# Patient Record
Sex: Male | Born: 1975
Health system: Northeastern US, Community
[De-identification: ages and names within clinical notes are randomized; demographics above are authoritative.]

## PROBLEM LIST (undated history)

## (undated) DIAGNOSIS — F32A Depression, unspecified: Secondary | ICD-10-CM

## (undated) DIAGNOSIS — J302 Other seasonal allergic rhinitis: Secondary | ICD-10-CM

## (undated) DIAGNOSIS — F329 Major depressive disorder, single episode, unspecified: Secondary | ICD-10-CM

---
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
## 2005-11-04 ENCOUNTER — Encounter (HOSPITAL_BASED_OUTPATIENT_CLINIC_OR_DEPARTMENT_OTHER): Payer: Self-pay

## 2006-03-31 HISTORY — PX: CURTG/CAUT ANAL FISSURE W/DILAT SPHNCTR SPX SBSQ: GID442

## 2006-09-02 ENCOUNTER — Encounter (HOSPITAL_BASED_OUTPATIENT_CLINIC_OR_DEPARTMENT_OTHER): Payer: Self-pay

## 2006-09-02 ENCOUNTER — Ambulatory Visit (HOSPITAL_BASED_OUTPATIENT_CLINIC_OR_DEPARTMENT_OTHER): Payer: PRIVATE HEALTH INSURANCE

## 2006-09-02 VITALS — BP 124/80 | HR 73 | Temp 98.4°F | Ht <= 58 in | Wt 191.0 lb

## 2006-09-02 DIAGNOSIS — Z0000 Encounter for general adult medical examination without abnormal findings: Principal | ICD-10-CM

## 2006-09-02 DIAGNOSIS — J301 Allergic rhinitis due to pollen: Secondary | ICD-10-CM

## 2006-09-02 DIAGNOSIS — K602 Anal fissure, unspecified: Secondary | ICD-10-CM

## 2006-09-02 LAB — BLOOD COUNT COMPLETE AUTO&AUTO DIFRNTL WBC
BASOPHIL %: 0.4 % (ref 0.0–2.0)
EOSINOPHIL %: 11.6 % — ABNORMAL HIGH (ref 0.0–7.0)
HEMATOCRIT: 43 % (ref 42.0–54.0)
HEMOGLOBIN: 14.4 g/dl (ref 14.0–18.0)
LYMPHOCYTE %: 28.2 % (ref 13.0–39.0)
MEAN CORP HGB CONC: 33.6 g/dl (ref 32.0–36.0)
MEAN CORPUSCULAR HGB: 30.3 pg (ref 27.0–33.0)
MEAN CORPUSCULAR VOL: 90.2 fl (ref 80.0–100.0)
MEAN PLATELET VOLUME: 9.3 fl (ref 6.4–10.8)
MONOCYTE %: 8.4 % (ref 1.0–12.0)
NEUTROPHIL %: 51.4 % (ref 46.0–79.0)
PLATELET COUNT: 204 10*3/uL (ref 150–400)
RBC DISTRIBUTION WIDTH: 13.1 % (ref 11.5–14.3)
RED BLOOD CELL COUNT: 4.77 M/uL (ref 4.50–6.10)
WHITE BLOOD CELL COUNT: 6.4 10*3/uL (ref 4.0–10.8)

## 2006-09-02 LAB — CHG LIPOPROTEIN DIR MEAS HIGH DENSITY CHOLESTEROL: HIGH DENSITY LIPOPROTEIN: 33 mg/dl (ref 29–71)

## 2006-09-02 LAB — GLUCOSE RANDOM: Glucose Random: 84 mg/dl (ref 74–160)

## 2006-09-02 LAB — CHOLESTEROL SERUM/WHOLE BLOOD TOTAL: Cholesterol: 128 mg/dl (ref 0–200)

## 2006-09-02 LAB — CHG CREATININE BLOOD: CREATININE: 0.9 mg/dl (ref 0.7–1.2)

## 2006-09-02 LAB — CHG LIPOPROTEIN DIRECT MEASUREMENT LDL CHOLESTEROL: LOW DENSITY LIPOPROTEIN DIRECT: 67 mg/dl (ref 0–100)

## 2006-09-02 MED ORDER — MOTRIN 400 MG PO TABS
ORAL_TABLET | ORAL | Status: DC
Start: 2006-09-02 — End: 2009-07-25

## 2006-09-02 MED ORDER — TRAMADOL HCL 50 MG PO TABS
ORAL_TABLET | ORAL | Status: DC
Start: 2006-09-02 — End: 2007-03-15

## 2006-09-02 MED ORDER — FLONASE 50 MCG/DOSE NA INHA
NASAL | Status: DC
Start: 2006-09-02 — End: 2007-06-07

## 2006-09-02 MED ORDER — ZYRTEC 10 MG PO TABS
ORAL_TABLET | ORAL | Status: DC
Start: 2006-09-02 — End: 2007-06-07

## 2006-09-02 NOTE — Progress Notes (Signed)
Here for first visit. 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 Has had two surgeries and pain only getting worse. Getting recurrent bleeding. Hard to work due to [employer]. Using metamucil and taking fluids. Taking Percocet for pain.     Also with seasonal allergies - taking claritin with minimal relief now. Increased nasal sx.     Past med hx - only surgery was related to anal fissure. NKDA  Social History   Marital Status: Single Spouse Name:    Years of Education: Number of children:     Occupational History   None on file    Social History Main Topics   Tobacco Use: Never    Alcohol Use: Yes 1.5 oz/week   3 drinks per week   Comment: on weekends   Drug Use: No    Sexual Activity: Yes Partners with: Male    Other Topics Concern   None on file    Social History Narrative   From [place]. In [place] since 2001. Works IT trainer. Lives with brther, sister in law and neice      Review of patient's family history indicates:   Diabetes    Comment: none   Heart Mother    Comment: MI age 21   Hypertension Brother     ROS:  no fevers or weight loss  no vision trouble  no hearing abnl  no nose bleeds or trouble swallowing - nasla stuffiness as noted  no chest pain or sob  no n/v/d/- does have constipation - related to fissure  No dysuria or discharge  no skin lesions  no focal weakness, numbness, tingling  no frequent HA  Sleeps less well due to pain, worry.  PHQ 9 at 17 - patient related most of sx to his anal fissure. Things going OK with girlfriend, family - mostly increased pressure due to sx and trouble with work as a result. Diet fairly well rounded although needs increased vegetables and increased water.    PE: Wd/wn NAD, looks well  H: at  [facility], EOMI, eye grounds normal  TMs, pinna no lesions  N - significant swollen purplish turbinates bilat/T no lesions  neck no masses, thyroid normal, no cerv adenopathy  lungs clear  heart S1S2 no m  abd soft no masses, non tender no HSM  GU bilat  desc testes, no masses, uncirc, retractable, no hernia  Rectal with non inflammed ext hemorroid. Fissure not acutely inflammed  normal pedal pulses  extrem FROM, no lesions  skin no lesions  neuro symmetric reflexes, strength, sensation    A: well exam  Seasonal rhinitis  Anal fissure with pain  Plan: screening labs  Meds as ntoed  Bacitracin ointment bid to rectal area  Increase fluids, vegetables  Refer GI for eval  Pain meds  F/U 2 months or prn.

## 2006-09-04 ENCOUNTER — Telehealth (HOSPITAL_BASED_OUTPATIENT_CLINIC_OR_DEPARTMENT_OTHER): Payer: Self-pay

## 2006-09-04 NOTE — Telephone Encounter (Addendum)
T/C to pt house, to inform lab from 09/02/06 normal.

## 2006-09-07 ENCOUNTER — Encounter (HOSPITAL_BASED_OUTPATIENT_CLINIC_OR_DEPARTMENT_OTHER): Payer: Self-pay

## 2006-10-26 ENCOUNTER — Ambulatory Visit (HOSPITAL_BASED_OUTPATIENT_CLINIC_OR_DEPARTMENT_OTHER): Payer: PRIVATE HEALTH INSURANCE | Admitting: Gastroenterology

## 2006-10-26 DIAGNOSIS — K602 Anal fissure, unspecified: Principal | ICD-10-CM

## 2006-11-18 ENCOUNTER — Ambulatory Visit (HOSPITAL_BASED_OUTPATIENT_CLINIC_OR_DEPARTMENT_OTHER): Payer: PRIVATE HEALTH INSURANCE

## 2006-11-18 VITALS — BP 112/80 | Temp 97.5°F | Wt 195.0 lb

## 2006-11-18 DIAGNOSIS — K602 Anal fissure, unspecified: Principal | ICD-10-CM

## 2006-11-18 NOTE — Progress Notes (Signed)
States is having persistent and quite uncomfortable pain from his perianal fissure. Has pain 4-5 hours after each BM despite using colcace. Is using bacitracin ointment without relief. Had sphincterectomy 12/07 and then botox injection in April. Saw GI end of July - given follow up in October which is not sufficient for pt given continued sx.    PE: Looks well, NAD    Anal fissure remain, stable    A: persistent anal fissure  Plan: refer surgery for further eval

## 2006-12-01 ENCOUNTER — Ambulatory Visit (HOSPITAL_BASED_OUTPATIENT_CLINIC_OR_DEPARTMENT_OTHER): Payer: PRIVATE HEALTH INSURANCE | Admitting: Surgery

## 2006-12-01 DIAGNOSIS — K6289 Other specified diseases of anus and rectum: Principal | ICD-10-CM

## 2006-12-07 ENCOUNTER — Ambulatory Visit (HOSPITAL_BASED_OUTPATIENT_CLINIC_OR_DEPARTMENT_OTHER): Payer: Self-pay | Admitting: Gastroenterology

## 2006-12-11 LAB — SURG SPEC CLINIC NOTE

## 2007-01-18 ENCOUNTER — Encounter (HOSPITAL_BASED_OUTPATIENT_CLINIC_OR_DEPARTMENT_OTHER): Payer: Self-pay

## 2007-01-18 ENCOUNTER — Emergency Department (HOSPITAL_BASED_OUTPATIENT_CLINIC_OR_DEPARTMENT_OTHER)
Admission: RE | Admit: 2007-01-18 | Disposition: A | Discharge status: Home / Self Care | Payer: Self-pay | Source: Emergency Department

## 2007-01-18 ENCOUNTER — Ambulatory Visit (HOSPITAL_BASED_OUTPATIENT_CLINIC_OR_DEPARTMENT_OTHER): Payer: Self-pay | Admitting: Gastroenterology

## 2007-01-18 MED ORDER — IBUPROFEN 600 MG PO TABS
ORAL_TABLET | ORAL | Status: AC
Start: 2007-01-18 — End: 2007-01-28

## 2007-01-18 MED ORDER — HYDROCODONE-ACETAMINOPHEN 5-500 MG PO TABS
ORAL_TABLET | ORAL | Status: AC
Start: 2007-01-18 — End: 2007-01-23

## 2007-01-18 NOTE — Discharge Instructions (Signed)
You have an appointment tomorrow morning 01/19/2007 at 9:30am at [facility].

## 2007-01-18 NOTE — ED Notes (Signed)
<  body>  <div align="left"><font face="Arial"><span style="font-size:12pt">Pt reports while tenth step of ladder, ladder fell closing on his foot, presents with   edema to ankle abrasion to calf</span></font></div>  </body>

## 2007-01-19 LAB — XR ANKLE RIGHT MINIMUM 3 VIEWS

## 2007-01-28 LAB — EMERGENCY ROOM NOTE

## 2007-03-15 ENCOUNTER — Encounter (HOSPITAL_BASED_OUTPATIENT_CLINIC_OR_DEPARTMENT_OTHER): Payer: Self-pay

## 2007-03-15 ENCOUNTER — Ambulatory Visit (HOSPITAL_BASED_OUTPATIENT_CLINIC_OR_DEPARTMENT_OTHER): Payer: PRIVATE HEALTH INSURANCE

## 2007-03-15 VITALS — BP 120/80 | HR 62 | Temp 98.2°F | Wt 202.0 lb

## 2007-03-15 DIAGNOSIS — F339 Major depressive disorder, recurrent, unspecified: Secondary | ICD-10-CM

## 2007-03-15 DIAGNOSIS — K602 Anal fissure, unspecified: Principal | ICD-10-CM

## 2007-03-15 MED ORDER — FLUOXETINE HCL 20 MG PO CAPS
ORAL_CAPSULE | ORAL | Status: DC
Start: 2007-03-15 — End: 2007-04-07

## 2007-03-19 ENCOUNTER — Ambulatory Visit (HOSPITAL_BASED_OUTPATIENT_CLINIC_OR_DEPARTMENT_OTHER): Payer: PRIVATE HEALTH INSURANCE | Admitting: Surgery

## 2007-03-19 DIAGNOSIS — K602 Anal fissure, unspecified: Principal | ICD-10-CM

## 2007-03-23 ENCOUNTER — Encounter (HOSPITAL_BASED_OUTPATIENT_CLINIC_OR_DEPARTMENT_OTHER): Payer: Self-pay

## 2007-03-24 ENCOUNTER — Telehealth (HOSPITAL_BASED_OUTPATIENT_CLINIC_OR_DEPARTMENT_OTHER): Payer: Self-pay

## 2007-03-26 LAB — SURG SPEC CLINIC NOTE

## 2007-04-07 ENCOUNTER — Ambulatory Visit (HOSPITAL_BASED_OUTPATIENT_CLINIC_OR_DEPARTMENT_OTHER): Payer: PRIVATE HEALTH INSURANCE

## 2007-04-07 VITALS — BP 110/80 | HR 61 | Temp 98.0°F | Wt 202.0 lb

## 2007-04-07 DIAGNOSIS — F339 Major depressive disorder, recurrent, unspecified: Principal | ICD-10-CM

## 2007-04-07 MED ORDER — FLUOXETINE HCL 20 MG PO CAPS
ORAL_CAPSULE | ORAL | Status: DC
Start: 2007-04-07 — End: 2007-06-07

## 2007-04-14 ENCOUNTER — Ambulatory Visit (HOSPITAL_BASED_OUTPATIENT_CLINIC_OR_DEPARTMENT_OTHER): Payer: Self-pay

## 2007-04-14 ENCOUNTER — Telehealth (HOSPITAL_BASED_OUTPATIENT_CLINIC_OR_DEPARTMENT_OTHER): Payer: Self-pay

## 2007-04-23 ENCOUNTER — Telehealth (HOSPITAL_BASED_OUTPATIENT_CLINIC_OR_DEPARTMENT_OTHER): Payer: Self-pay

## 2007-04-28 ENCOUNTER — Telehealth (HOSPITAL_BASED_OUTPATIENT_CLINIC_OR_DEPARTMENT_OTHER): Payer: Self-pay

## 2007-04-28 NOTE — Telephone Encounter (Signed)
Tc l vm

## 2007-05-19 ENCOUNTER — Ambulatory Visit (HOSPITAL_BASED_OUTPATIENT_CLINIC_OR_DEPARTMENT_OTHER): Payer: Self-pay

## 2007-05-24 ENCOUNTER — Telehealth (HOSPITAL_BASED_OUTPATIENT_CLINIC_OR_DEPARTMENT_OTHER): Payer: Self-pay

## 2007-05-24 NOTE — Telephone Encounter (Signed)
Tc   Lvm

## 2007-05-31 ENCOUNTER — Telehealth (HOSPITAL_BASED_OUTPATIENT_CLINIC_OR_DEPARTMENT_OTHER): Payer: Self-pay

## 2007-05-31 NOTE — Telephone Encounter (Signed)
TC  Unable to reach

## 2007-06-07 ENCOUNTER — Ambulatory Visit (HOSPITAL_BASED_OUTPATIENT_CLINIC_OR_DEPARTMENT_OTHER): Payer: PRIVATE HEALTH INSURANCE

## 2007-06-07 ENCOUNTER — Encounter (HOSPITAL_BASED_OUTPATIENT_CLINIC_OR_DEPARTMENT_OTHER): Payer: Self-pay

## 2007-06-07 VITALS — BP 100/70 | HR 53 | Temp 97.8°F | Wt 209.0 lb

## 2007-06-07 DIAGNOSIS — F339 Major depressive disorder, recurrent, unspecified: Principal | ICD-10-CM

## 2007-06-07 DIAGNOSIS — J301 Allergic rhinitis due to pollen: Secondary | ICD-10-CM

## 2007-06-07 MED ORDER — FLONASE 50 MCG/DOSE NA INHA
NASAL | Status: DC
Start: 2007-06-07 — End: 2007-08-09

## 2007-06-07 MED ORDER — FLUOXETINE HCL 20 MG PO CAPS
ORAL_CAPSULE | ORAL | Status: DC
Start: 2007-06-07 — End: 2007-08-09

## 2007-06-07 MED ORDER — ZYRTEC 10 MG PO TABS
ORAL_TABLET | ORAL | Status: DC
Start: 2007-06-07 — End: 2007-08-09

## 2007-06-07 NOTE — Progress Notes (Signed)
Here for follow up depression. Continues to feel that he is coping better, has more energy, and feeling better. Has some "down days" but is going to the gym, working out. Is to schedule surgery for anal fissure which continues to bother him.    Seasonal allergies to start relatively soon - we discussed starting flonase a few weeks before sx usually begin.    PE; looks well, interactive, good eye contact.  PHQ 9 - stable at 11 (19 initially).      A: depression - improved  Allergic rhinitis/seasonal allergies  Plan: cointinue current meds  Refill allergy meds.  F/u 2 months or prn

## 2007-06-21 ENCOUNTER — Encounter (HOSPITAL_BASED_OUTPATIENT_CLINIC_OR_DEPARTMENT_OTHER): Payer: Self-pay

## 2007-08-09 ENCOUNTER — Ambulatory Visit (HOSPITAL_BASED_OUTPATIENT_CLINIC_OR_DEPARTMENT_OTHER): Payer: PRIVATE HEALTH INSURANCE

## 2007-08-09 VITALS — BP 100/60 | HR 66 | Temp 99.0°F | Wt 207.0 lb

## 2007-08-09 DIAGNOSIS — F339 Major depressive disorder, recurrent, unspecified: Principal | ICD-10-CM

## 2007-08-09 DIAGNOSIS — J301 Allergic rhinitis due to pollen: Secondary | ICD-10-CM

## 2007-08-09 MED ORDER — FLONASE 50 MCG/DOSE NA INHA
NASAL | Status: DC
Start: 2007-08-09 — End: 2009-07-25

## 2007-08-09 MED ORDER — ZYRTEC 10 MG PO TABS
ORAL_TABLET | ORAL | Status: DC
Start: 2007-08-09 — End: 2009-07-25

## 2007-08-09 NOTE — Progress Notes (Signed)
Doing well. Would like to have trial of stopping antidepressant meds. Has been on treatment for 6 months. Ran out a week ago and did not refill. Involved with friends, active socially    Seasonal allergies a problem now - wonders if can take meds Bid.    PE: looks well, animated  Increased turbinates with nasal congestion evident    A: depression - improved  Allergic rhinitis  Plan: OK for trial d/c prozac. Encourage regular exercise, activity  If return of sx can restart meds  Trial increase flonase twice a day - may decrease if sx better.  Call if sx worsen.

## 2007-09-10 ENCOUNTER — Encounter (HOSPITAL_BASED_OUTPATIENT_CLINIC_OR_DEPARTMENT_OTHER): Payer: Self-pay

## 2008-05-31 ENCOUNTER — Ambulatory Visit (HOSPITAL_BASED_OUTPATIENT_CLINIC_OR_DEPARTMENT_OTHER): Payer: PRIVATE HEALTH INSURANCE

## 2009-07-25 ENCOUNTER — Encounter (HOSPITAL_BASED_OUTPATIENT_CLINIC_OR_DEPARTMENT_OTHER): Payer: Self-pay

## 2009-07-25 ENCOUNTER — Ambulatory Visit (HOSPITAL_BASED_OUTPATIENT_CLINIC_OR_DEPARTMENT_OTHER): Payer: PRIVATE HEALTH INSURANCE

## 2009-07-25 VITALS — BP 126/80 | HR 75 | Temp 98.1°F | Resp 16 | Wt 207.0 lb

## 2009-07-25 DIAGNOSIS — J301 Allergic rhinitis due to pollen: Secondary | ICD-10-CM

## 2009-07-25 DIAGNOSIS — F339 Major depressive disorder, recurrent, unspecified: Principal | ICD-10-CM

## 2009-07-25 MED ORDER — FLUOXETINE HCL 20 MG PO CAPS
20.0000 mg | ORAL_CAPSULE | Freq: Every day | ORAL | Status: DC
Start: 2009-07-25 — End: 2009-09-26

## 2009-07-25 MED ORDER — CETIRIZINE HCL 10 MG PO TABS
10.00 mg | ORAL_TABLET | Freq: Every day | ORAL | Status: AC
Start: 2009-07-25 — End: 2010-07-26

## 2009-07-25 MED ORDER — TRAZODONE HCL 50 MG PO TABS
ORAL_TABLET | ORAL | Status: DC
Start: 2009-07-25 — End: 2009-09-26

## 2009-07-25 NOTE — Progress Notes (Signed)
Here with complaints of recurrent sx of depression. Had been doing well with a girlfriend and planning to get married. She had to leave and return to [place] one year ago. They tried to keep relationship but decided to break up in August. "This was very tough for me". Trying to go out with friends but does not feel good. Feels a bit confused. Denies suicidal ideation but feels anxious and scared at times. Had been given citalopram by another physician and then got it sent from [place]. Still working even though it is hard to keep motivated. Trying to go to gym 3 times a week and "the only time I really feel OK" is when at gym. Sleep is variable - sleeps better after working out.    Also - this is time for flare for seasonal allergies although not as bas\d this time.    History reviewed.  No pertinent past medical history.    Social History   Marital Status: Single  Spouse Name: N/A    Years of Education: N/A  Number of Children: N/A     Occupational History  None on file     Social History Main Topics   Tobacco Use: Never    Alcohol Use: Yes  2.5 oz/week    5 drink(s) per week         Comment: on weekends    Drug Use: No    Sexually Active: Not Currently  Partner(s): Male     Other Topics Concern   None on file     Social History Narrative    From [place]. In [place] since 2001. 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

## 2009-07-31 ENCOUNTER — Encounter (HOSPITAL_BASED_OUTPATIENT_CLINIC_OR_DEPARTMENT_OTHER): Payer: Self-pay

## 2009-08-08 ENCOUNTER — Ambulatory Visit (HOSPITAL_BASED_OUTPATIENT_CLINIC_OR_DEPARTMENT_OTHER): Payer: PRIVATE HEALTH INSURANCE

## 2009-08-16 ENCOUNTER — Emergency Department (HOSPITAL_BASED_OUTPATIENT_CLINIC_OR_DEPARTMENT_OTHER)
Admission: RE | Admit: 2009-08-16 | Disposition: A | Discharge status: Home / Self Care | Payer: Self-pay | Source: Emergency Department | Attending: Emergency Medicine | Admitting: Emergency Medicine

## 2009-08-16 ENCOUNTER — Encounter (HOSPITAL_BASED_OUTPATIENT_CLINIC_OR_DEPARTMENT_OTHER): Payer: Self-pay

## 2009-08-16 HISTORY — DX: Depression, unspecified: F32.A

## 2009-08-16 HISTORY — DX: Major depressive disorder, single episode, unspecified: F32.9

## 2009-08-16 HISTORY — DX: Other seasonal allergic rhinitis: J30.2

## 2009-08-16 MED ORDER — ONDANSETRON HCL 4 MG PO TABS
4.0000 mg | ORAL_TABLET | Freq: Three times a day (TID) | ORAL | Status: AC | PRN
Start: 2009-08-16 — End: 2009-09-15

## 2009-08-16 MED ORDER — MECLIZINE HCL 25 MG PO TABS
25.00 mg | ORAL_TABLET | Freq: Four times a day (QID) | ORAL | Status: AC | PRN
Start: 2009-08-16 — End: 2009-09-15

## 2009-08-16 NOTE — Discharge Instructions (Signed)
Vertigem (Labirintite, Tontura)  (Vertigo (Labyrinthitis, Dizziness))     O diagnóstico é que você tem vertigens, ou seja, a sensação ou ilusão que você se move quando você está parado. O maior problema é a possibilidade de você ter um ataque desses enquanto trabalha, dirige ou desempenha outras atividades complexas, podendo se machucar ou ferir outra pessoa. Pode parecer que o mundo gira à sua volta ou que você está caindo no chão, mas na verdade está em pé. Quando isto ocorre, você realmente pode vir a se jogar no chão, com a impressão que está tentando se manter em pé. A perturbação excessiva do equilíbrio freqüentemente cria náusea e vômito. Muitas vezes é bem difícil determinar as causas exatas envolvidas e muitas hipóteses são formuladas. Pode ser de origem infecciosa (viral ou bacteriana), relacionada à toxicidade de drogas, posicional (provocada meramente por uma mudança rápida de posição, tal como ao deitar ou rolar na cama). Ocorre com a Síndrome de Menière e muitas outras, conhecidas e hipotéticas (adivinhadas). As causas diferentes para os sintoma podem encher um capítulo extenso de um livro, mas a boa nova é que seu médico normalmente pode determinar a causa e tratá-la, ajudando-o. Inclui exame de neurológico e, freqüentemente, algum tipo de exame especializado.      INSTRUÇÕES PARA TRATAMENTO DOMICILIAR  Ø Siga as instruções do médico.   Ø Enquanto tiver sofrendo ataques, evite dirigir, operar maquinário pesado ou executar outras tarefas que seriam perigosas para você ou outros.   Ø Se a causa for medicamentos, evite-os e informe seu médico se notar que certa medicação parece estar associada a ataques. Alguns medicamentos empregados para tratar ataques realmente pode agravá-los. Fique alerta e informe seu médico se isto ocorrer.   Ø Se o problema que você têm é posicional, seu médico pode lhe instruir sobre movimentos e procedimentos que podem ajudar.     TELEFONE OU RETORNE PARA CUIDADOS MÉDICOS:  Ø  Se os medicamentos não parecem aliviar os ataques ou fazem piorar.   Ø Se desenvolver mudanças no tocante ao que o levou a procurar cuidados, inclusive:  l Se tiver forte dor de cabeça.   l Se náusea ou vômito não melhorar ou ficar mais forte.   l Se desenvolver alteração na visão.  l Se um membro da família ou conhecido notar mudanças em seu comportamento.   l Se houver alteração em sua condição que pareça ser mais uma mudança para o pior do que melhora.        Document Released: 03/17/2005    ExitCare® Patient Information ©2010 [employer].

## 2009-08-20 ENCOUNTER — Other Ambulatory Visit (HOSPITAL_BASED_OUTPATIENT_CLINIC_OR_DEPARTMENT_OTHER): Payer: PRIVATE HEALTH INSURANCE

## 2009-08-21 ENCOUNTER — Other Ambulatory Visit (HOSPITAL_BASED_OUTPATIENT_CLINIC_OR_DEPARTMENT_OTHER): Payer: Self-pay | Admitting: Clinical

## 2009-08-22 ENCOUNTER — Telehealth (HOSPITAL_BASED_OUTPATIENT_CLINIC_OR_DEPARTMENT_OTHER): Payer: Self-pay | Admitting: Registered Nurse

## 2009-08-23 ENCOUNTER — Telehealth (HOSPITAL_BASED_OUTPATIENT_CLINIC_OR_DEPARTMENT_OTHER): Payer: Self-pay | Admitting: Registered Nurse

## 2009-08-24 LAB — EMERGENCY ROOM NOTE

## 2009-08-29 ENCOUNTER — Ambulatory Visit (HOSPITAL_BASED_OUTPATIENT_CLINIC_OR_DEPARTMENT_OTHER): Payer: PRIVATE HEALTH INSURANCE

## 2009-08-29 VITALS — BP 130/70 | HR 72 | Temp 97.0°F | Resp 16 | Ht 77.0 in | Wt 205.0 lb

## 2009-08-29 DIAGNOSIS — H832X9 Labyrinthine dysfunction, unspecified ear: Secondary | ICD-10-CM

## 2009-08-29 DIAGNOSIS — F339 Major depressive disorder, recurrent, unspecified: Principal | ICD-10-CM

## 2009-08-29 DIAGNOSIS — H6120 Impacted cerumen, unspecified ear: Secondary | ICD-10-CM

## 2009-08-29 NOTE — Progress Notes (Signed)
Here for follow up depression. Is feeling a little better but stopped prozac last week due to onset positional vertigo. Seen in ED. Antivert not that helpful but is feeling less sx now. Depression slt better - is active with gym. Had some discharge R ear - dark colored two months ago but no other sx noted. 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

## 2009-09-10 ENCOUNTER — Ambulatory Visit (HOSPITAL_BASED_OUTPATIENT_CLINIC_OR_DEPARTMENT_OTHER): Payer: PRIVATE HEALTH INSURANCE

## 2009-09-10 DIAGNOSIS — F331 Major depressive disorder, recurrent, moderate: Principal | ICD-10-CM

## 2009-09-10 DIAGNOSIS — F411 Generalized anxiety disorder: Secondary | ICD-10-CM

## 2009-09-10 NOTE — Progress Notes (Signed)
ADULT PSYCHIATRY INITIAL EVALUATION    CHIEF COMPLAINT: "Sadness"    HISTORY of PRESENT ILLNESS:   Patient reported that symptoms of depression and anxiety started in August of last year when his girlfriend ended their romantic relationship. Girlfriend is from the [place] and she was working here as an IT consultant. They had been dating for three years and they had agreed that she was going to spend some time in her country and then they would move to [place]. After six months in the [place], she decided to end the relationship, citing that she had gotten a good job and furthermore, patient was taking too long to move to [place]. As a result, patient was devastated and started feeling very sad, crying all time, with decreased energy, anhedonia, very worried, not sleeping well, with decreased appetite and weight loss. Patient was prescribed psychotropic medications and improved a bit, but he is still complaining of sadness, lack of energy, lack of motivation, feeling guilty, with intrusive thoughts about the relationship. Patient is still not working his regular hours because there are days when he does not have the energy to get to work.   He also feels anxious, bites his nails a lot, is excessively worried and has negative thoughts.    CURRENT MEDICATIONS:   Current outpatient prescriptions:meclizine (ANTIVERT) 25 MG TABS, Take 1 tablet by mouth every 6 (six) hours as needed. for symptoms of vertigo, Disp: 20 tablet, Rfl: 0;  ondansetron (ZOFRAN) 4 MG tablet, Take 1 tablet by mouth every 8 (eight) hours as needed for Nausea. for nausea and vomitting, Disp: 8 tablet, Rfl: 0;  fluoxetine (PROZAC) 20 MG capsule, Take 1 capsule by mouth daily., Disp: 30 capsule, Rfl: 1  trazodone (DESYREL) 50 MG tablet, Take  by mouth. One to two tablets at night for sleep, Disp: 60 tablet, Rfl: 0;  cetirizine (ZYRTEC) 10 MG tablet, Take 1 tablet by mouth daily., Disp: 30 tablet, Rfl: 6    Past Medications:   Took antidepressant  in 2004 for a brief period with good benefits. Does not remember the name.     CURRENT TREATMENT:   PCP is prescribing psychotropic medications    System Involvement: None.    PAST PSYCHIATRIC HISTORY:   Depression in 2004 due to financial issues as well as missing family in [place]. He took antidepressants sent by sister from [place] for a few months. It was very effective.  He denies a history of hospitalization, violence, suicide and psychosis    SUBSTANCE USE: Used to drink socially, but not drinking now since he has stopped going out.     Family Constellation:   Father-Died from a heart attack one and a half year ago.   Mother-Died of a heart attack at the age of 10. Two brothers and one sister. One brother used to live here, but he has recently moved back to [place].    Biological Family History:   Sister with depression  Brother with history of ETOH abuse, but sober now.     CURRENT LIVING SITUATION/CURRENT SUPPORTS:   Lives with ex-sister in law and niece. Brother used to live with them, but he moved back to [place]. He is very close to his sister in law and niece. He used to have many friends, but since he became depressed and started to refuse their invitation to go out, they stopped making contact with him.    Social History:   He was born in [place], population of [place] At the age of 73, he moved to a big city [facility]) to work with his brother in his business. 10 years later, he returned to his parents' house. He worked in the parents' farm for six years, but then he decided to come to the [place]. He has been living here for the past 10 years. He currently works in Holiday representative. He didn't finish High School.   He is thinking of moving back to [place] at the end of this year, but he is not sure whether moving back is a good idea.     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]     Major Depression, Recurrent [296.30P]    PCP: Tonye Pearson, MD    MENTAL STATUS EXAM:  Appearance: Casually dressed, well groomed   Behavior: Cooperative   Alertness: Alert   Speech: Normal rate and volume   Mood: Sad, worried   Affect: Sad, worried,   Thought Process: logical, linear   Thought Content: No delusional thinking   Perceptions: Denied Hallucinations   Judgment/Impulse Control: Good/Good   Insight: Fair   Cognition: Intact   Suicidal/Homicidal: Denied    BIO/PSYCHO/SOCIAL AND RISK FORMULATION(S):   Patient is a 34 year old, single [place] man who has been struggling with marked symptoms of depression and anxiety since August of last year in the context of break up with girlfriend. During this session, he was engaged with therapist and denied safety issues.     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 Medication prescribed by PCP, transfer to Psychiatrist if needed.     Noralyn Pick, LICSW

## 2009-09-24 ENCOUNTER — Ambulatory Visit (HOSPITAL_BASED_OUTPATIENT_CLINIC_OR_DEPARTMENT_OTHER): Payer: PRIVATE HEALTH INSURANCE

## 2009-09-26 ENCOUNTER — Ambulatory Visit (HOSPITAL_BASED_OUTPATIENT_CLINIC_OR_DEPARTMENT_OTHER): Payer: PRIVATE HEALTH INSURANCE

## 2009-09-26 ENCOUNTER — Encounter (HOSPITAL_BASED_OUTPATIENT_CLINIC_OR_DEPARTMENT_OTHER): Payer: Self-pay

## 2009-09-26 VITALS — BP 116/78 | HR 79 | Temp 98.4°F | Resp 18 | Ht 77.0 in | Wt 204.0 lb

## 2009-09-26 DIAGNOSIS — F339 Major depressive disorder, recurrent, unspecified: Principal | ICD-10-CM

## 2009-09-26 DIAGNOSIS — R42 Dizziness and giddiness: Secondary | ICD-10-CM

## 2009-09-26 MED ORDER — FLUOXETINE HCL 20 MG PO CAPS
20.0000 mg | ORAL_CAPSULE | Freq: Every day | ORAL | Status: DC
Start: 2009-09-26 — End: 2010-01-16

## 2009-11-14 ENCOUNTER — Other Ambulatory Visit (HOSPITAL_BASED_OUTPATIENT_CLINIC_OR_DEPARTMENT_OTHER): Payer: PRIVATE HEALTH INSURANCE

## 2010-01-10 ENCOUNTER — Telehealth (HOSPITAL_BASED_OUTPATIENT_CLINIC_OR_DEPARTMENT_OTHER): Payer: Self-pay | Admitting: Registered Nurse

## 2010-01-16 ENCOUNTER — Ambulatory Visit (HOSPITAL_BASED_OUTPATIENT_CLINIC_OR_DEPARTMENT_OTHER): Payer: PRIVATE HEALTH INSURANCE | Admitting: Family Medicine

## 2010-01-16 VITALS — BP 130/84 | HR 56 | Temp 97.6°F | Resp 18 | Wt 203.0 lb

## 2010-01-16 DIAGNOSIS — Z23 Encounter for immunization: Secondary | ICD-10-CM

## 2010-01-16 DIAGNOSIS — F339 Major depressive disorder, recurrent, unspecified: Principal | ICD-10-CM

## 2010-01-16 DIAGNOSIS — F419 Anxiety disorder, unspecified: Secondary | ICD-10-CM

## 2010-01-16 MED ORDER — VENLAFAXINE HCL ER 37.5 MG PO CP24
37.5000 mg | ORAL_CAPSULE | Freq: Every day | ORAL | Status: DC
Start: 2010-01-16 — End: 2010-02-27

## 2010-02-27 ENCOUNTER — Ambulatory Visit (HOSPITAL_BASED_OUTPATIENT_CLINIC_OR_DEPARTMENT_OTHER): Payer: PRIVATE HEALTH INSURANCE | Admitting: Family Medicine

## 2010-02-27 VITALS — BP 110/68 | HR 57 | Temp 98.0°F | Resp 18 | Ht 77.0 in | Wt 199.0 lb

## 2010-02-27 DIAGNOSIS — F339 Major depressive disorder, recurrent, unspecified: Principal | ICD-10-CM

## 2010-02-27 LAB — HEMATOCRIT: HEMATOCRIT: 41.8 % — ABNORMAL LOW (ref 42.0–54.0)

## 2010-02-27 LAB — TSH (THYROID STIMULATING HORMONE): TSH (THYROID STIM HORMONE): 1.61 u[IU]/mL (ref 0.34–5.60)

## 2010-02-27 MED ORDER — VENLAFAXINE HCL ER 75 MG PO CP24
75.0000 mg | ORAL_CAPSULE | Freq: Every day | ORAL | Status: DC
Start: 2010-02-27 — End: 2010-11-11

## 2010-03-01 ENCOUNTER — Encounter (HOSPITAL_BASED_OUTPATIENT_CLINIC_OR_DEPARTMENT_OTHER): Payer: Self-pay | Admitting: Family Medicine

## 2010-03-15 ENCOUNTER — Other Ambulatory Visit (HOSPITAL_BASED_OUTPATIENT_CLINIC_OR_DEPARTMENT_OTHER): Payer: Self-pay | Admitting: Clinical

## 2010-04-18 ENCOUNTER — Ambulatory Visit (HOSPITAL_BASED_OUTPATIENT_CLINIC_OR_DEPARTMENT_OTHER): Payer: PRIVATE HEALTH INSURANCE

## 2010-04-18 NOTE — Progress Notes (Signed)
Encounter opened by mistake

## 2010-04-29 ENCOUNTER — Encounter (HOSPITAL_BASED_OUTPATIENT_CLINIC_OR_DEPARTMENT_OTHER): Payer: Self-pay | Admitting: Family Medicine

## 2010-04-29 ENCOUNTER — Ambulatory Visit (HOSPITAL_BASED_OUTPATIENT_CLINIC_OR_DEPARTMENT_OTHER): Payer: PRIVATE HEALTH INSURANCE | Admitting: Family Medicine

## 2010-04-29 VITALS — BP 120/82 | HR 61 | Temp 98.8°F | Resp 19 | Ht 77.0 in | Wt 201.0 lb

## 2010-04-29 DIAGNOSIS — F339 Major depressive disorder, recurrent, unspecified: Principal | ICD-10-CM

## 2010-08-16 ENCOUNTER — Ambulatory Visit (HOSPITAL_BASED_OUTPATIENT_CLINIC_OR_DEPARTMENT_OTHER): Payer: PRIVATE HEALTH INSURANCE | Admitting: Family Medicine

## 2010-08-27 ENCOUNTER — Ambulatory Visit (HOSPITAL_BASED_OUTPATIENT_CLINIC_OR_DEPARTMENT_OTHER): Payer: PRIVATE HEALTH INSURANCE | Admitting: Family Medicine

## 2010-11-11 ENCOUNTER — Ambulatory Visit (HOSPITAL_BASED_OUTPATIENT_CLINIC_OR_DEPARTMENT_OTHER): Payer: PRIVATE HEALTH INSURANCE | Admitting: Family Medicine

## 2010-11-11 VITALS — BP 130/88 | HR 73 | Temp 97.7°F | Resp 22 | Wt 214.0 lb

## 2010-11-11 DIAGNOSIS — F339 Major depressive disorder, recurrent, unspecified: Principal | ICD-10-CM

## 2010-11-11 MED ORDER — VENLAFAXINE HCL ER 75 MG PO CP24
75.0000 mg | ORAL_CAPSULE | Freq: Every day | ORAL | Status: DC
Start: 2010-11-11 — End: 2011-05-10

## 2010-11-11 NOTE — Progress Notes (Signed)
SUBJECTIVE:  35 year old male  presents for f/u deperssion. Still a little bit not movitaed. Sleeping is still not great but getting better. Would like 8 hours of sleep. Still concenerd about going back to [place]. That is a main concern.   No wt changes. Focus okay.   Saw therapist once. Seemed to heop maybe a little. And felt getting better.  No si/hi  eucolaptis prices down    Smoking Status: Never Smoker                      Alcohol Use: Yes           2.5 oz/week       5 Drinks containing 0.5 oz of alcohol per week       Comment: on weekends       Drug Use: No        Social History Narrative    From [place]. In [place] since 2001.         Works IT trainer.     And sells eucalyptus trees        Lives with brother, sister in law and neice         Family History    Diabetes      Comment: none    Heart Mother     Comment: MI age 2    Hypertension Brother     Cancer - Other      Comment: none         OBJECTIVE:  general: alert, well appearing, no distress   BP 130/88  Pulse 73  Temp(Src) 97.7 F (36.5 C) (Oral)  Resp 22  Wt 214 lb (97.07 kg)  SpO89 71%   35 year old [place] seen today for the following issues:    296.30P Major depression, recurrent  (primary encounter diagnosis)  Comment: phq9 reviwed, getting better. discussed options  Plan: venlafaxine (EFFEXOR-XR) 75 MG 24 hr capsule        cotninue  And he plans to make decision about ?return to [place] this November.    F/u in two mon, fasting, for labs and PE     The pt understands and agrees with the plan.   I have spent 15 minutes in face to face time with this patient/patient proxy of which > 50% was in counseling or coordination of care regarding above issues/Dx.

## 2011-02-11 ENCOUNTER — Ambulatory Visit (HOSPITAL_BASED_OUTPATIENT_CLINIC_OR_DEPARTMENT_OTHER): Payer: PRIVATE HEALTH INSURANCE | Admitting: Family Medicine

## 2011-07-07 ENCOUNTER — Encounter (HOSPITAL_BASED_OUTPATIENT_CLINIC_OR_DEPARTMENT_OTHER): Payer: Self-pay | Admitting: Family Medicine

## 2011-07-07 ENCOUNTER — Ambulatory Visit (HOSPITAL_BASED_OUTPATIENT_CLINIC_OR_DEPARTMENT_OTHER): Payer: Self-pay | Admitting: Family Medicine

## 2011-07-07 VITALS — BP 140/70 | HR 75 | Temp 99.1°F | Resp 20 | Wt 224.0 lb

## 2011-07-07 DIAGNOSIS — F339 Major depressive disorder, recurrent, unspecified: Principal | ICD-10-CM

## 2011-07-07 MED ORDER — SERTRALINE HCL 50 MG PO TABS
ORAL_TABLET | Freq: Every day | ORAL | Status: DC
Start: 2011-07-07 — End: 2011-11-06

## 2011-07-07 NOTE — Progress Notes (Signed)
.    Pt safe at home. OME

## 2011-07-07 NOTE — Progress Notes (Signed)
SUBJECTIVE:  36 year old male  presents for f/u depression.  Lost interst in things, 'have to drage myself to go to work". Hard to focus, hard to think staright, forgets words.  Plans in [place] are not working out as well as he wanted, investments not panning out.   Not hanging out w/ friedsn. Not married  And gets anxious, and bites his nails.  Feels stuck.  Low self esteem.  No si/hi  Doesn't sleep well    Smoking Status: Never Smoker                      Alcohol Use: Yes           2.5 oz/week       5 Drinks containing 0.5 oz of alcohol per week       Comment: on weekends       Drug Use: No        Social History Narrative    From [place]. In [place] since 2001.     Works IT trainer.     And sells eucalyptus trees      Lives with brother, sister in law and niece        Family History    Diabetes      Comment: none    Heart Mother     Comment: MI age 27    Hypertension Brother     Cancer - Other      Comment: none           OBJECTIVE:  general: alert, well appearing, no distress   BP 140/70  Pulse 75  Temp(Src) 99.1 F (37.3 C) (Temporal)  Resp 20  Wt 224 lb (101.606 kg)  SpO2 97%   Neck: Supple, no mass, no lad   Cvs:Rrr, no murmur   36 year old [place] seen today for the following issues:    296.30P Major depression, recurrent  (primary encounter diagnosis)  Comment: discussed options. He is not sure if the medicine helped, he was on a mid-ragne dose of the effexor. He also has signficant axiety.  Plan:  -decrase caffeine intake! (slowly. Discussed)  -REFERRAL TO ADULT PSYCHIATRY ( [facility])        For psychotherapy  -trial sertraline. Nurse or I will call pt in about two weeks see how doing, consdier increase dose at that time  -And f/u with pcp or me       The pt understands and agrees with the plan.   I have spent 15 minutes in face to face time with this patient/patient proxy of which > 50% was in counseling or coordination of care regarding above issues/Dx.

## 2011-07-07 NOTE — Patient Instructions (Signed)
1) avoid the supplements that have caffeine     2) If the mental health department has not called you within the next 3 business days, please call them at [phone] so that they can assist you in obtaining an appointment.    3) sertraline

## 2011-07-23 ENCOUNTER — Telehealth (HOSPITAL_BASED_OUTPATIENT_CLINIC_OR_DEPARTMENT_OTHER): Payer: Self-pay | Admitting: Family Medicine

## 2011-07-23 NOTE — Telephone Encounter (Signed)
Spoke with patient  Started taking about a 10 days ago  Did a full week of half tablet and now full tablet  No improvement, in fact would say symptoms are worse  Very sad, no motivation, very sleepy, depressed   Late to work once last week and call out sick one day  Tired all the time, wakes up several times in the night  No N/V/D, no other symptoms on the medications  Let him know the plan may be to increase dose  RN to notify provider  Agrees with plan

## 2011-07-23 NOTE — Telephone Encounter (Signed)
i called pt [phone] ,to see how doing and if medicine (ssri) helpiong him w/ depression and anxiety, if tolerating it well and if would like to increase dose at this time.  No answer, message left for him to call back.    To [facility] nurse:    If/when he calls back , can you ask him above questions.  The medicine is sertraline (ZOLOFT) 50 MG tablet . And plan would be to increase to 100mg  daily    Thank you,  Rocky Link.

## 2011-07-23 NOTE — Telephone Encounter (Signed)
Left a message on an identified voice mail  To call clinic if still with questions or concerns  Await a call back

## 2011-07-24 NOTE — Telephone Encounter (Signed)
Left a message on an identified voice mail  To call clinic if still with questions or concerns  Await a call back

## 2011-07-24 NOTE — Telephone Encounter (Signed)
Spoke with patient  Relayed message and plan  Doesn't want to come into see PCP or Dr. Billey Gosling at this time  Will continue on current dose for now and will look forward to a call next week  He will monitor symptoms and call with concerns  Rn to notify provider

## 2011-07-24 NOTE — Telephone Encounter (Signed)
To [facility] nurse:    Thank you for this information.  Let us keep the one tablet per day for now as is a little early to increase dose. i will call him next week to see how he is doing. And in the meantime he should not hesitate to call if he continues to feel poorly.   i see he has an appt w/ therpiast but that is not for a couple weeks. i can always meet with him before them if he would find that helpful.    Thank you,  Rocky Link.

## 2011-07-29 ENCOUNTER — Telehealth (HOSPITAL_BASED_OUTPATIENT_CLINIC_OR_DEPARTMENT_OTHER): Payer: Self-pay | Admitting: Family Medicine

## 2011-07-29 NOTE — Telephone Encounter (Signed)
i called pt to check in. No answer, message left.  I will try to reach him again at a later date.

## 2011-08-13 ENCOUNTER — Ambulatory Visit (HOSPITAL_BASED_OUTPATIENT_CLINIC_OR_DEPARTMENT_OTHER): Payer: PRIVATE HEALTH INSURANCE

## 2011-08-13 DIAGNOSIS — F339 Major depressive disorder, recurrent, unspecified: Principal | ICD-10-CM

## 2011-08-13 NOTE — Progress Notes (Signed)
PSYCHIATRY OUTPATIENT PROGRESS NOTE    VISIT TYPE: Psychotherapy         PROBLEMS which this visit addressed:   Problem 1: chronic depression    Problem 2: indecision about return to [place]         SOURCE(S) OF INFORMATION:  Patient     SUBJECTIVE FINDINGS:  "I have taken various medications over the last several years but nothing seems to help much. Most recently my PCP gave me sertraline but I stopped on my own after a month as it made me feel worse - More tired, over-sleeping and low libido. I've been depressed the last 3 years. When I first came to the [place] in 2001, I was really motivated. I worked hard, saved money and made some investments in [place]. Now I know I need to make some changes but I am crippled by indecision. My father lost a lot of his business when I lived there, and I am haunted by that. I am also lonely. I was dating a [place] woman but she went back to her country in 2010 and decided to stay. I was heartbroken. I had one girlfriend since then, but nobody now. I work a few days a week in [employer] but when [employer] home, I just lie in bed. I rent a room in the basement of my brother's house. There are hardly any windows. Everyone tells me that I should move, but I can't seem to decide anything. The only good thing in my life is going to the gym. I go about 3 times a week and feel better for a while afterwards, but slowly the depressed mood settles in again. I know I really need to make some changes. I still have a few friends but no one really calls me these days as I never go when invited."                                                    OBJECTIVE FINDINGS:     Appearance: Casually dressed, well groomed   Behavior: Cooperative   Alertness: Alert   Speech: Normal rate and volume   Mood: Sad, worried   Affect: full range  Thought Process: logical, linear   Thought Content: No delusional thinking   Perceptions: Denied Hallucinations   Judgment/Impulse Control: Good/Good   Insight: Fair   Cognition:  Intact   Suicidal/Homicidal: Denied      Signs and symptoms: does not get out of bed on days that he is home. Extremely low motivation. Isolated other than going to work a few days a week. Has roommate but does not interact much with him. Shares meals with sister-in-law who lives upstairs but otherwise has little social contact    Testing results:  No test results pending.        Risk behaviors: None reported.        ASSESSMENT:  Clinical formulation:    Patient is a 36 year old, single [place] man who has been struggling with marked symptoms of depression and anxiety for the last 3 years in the context of break up with [place]  girlfriend. During this session, he was engaged with therapist and denied safety issues.         Clinical interventions today and patient's response: first session with pt today. 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 Doreatha Martin 09/23/11. Discussed use of PES if symptoms increase or if he has any safety concerns     Risk plan (for patients at moderate/high risk for suicide/violence/addiction): Patient not at moderate or high risk.    Next visit: patient to be seen in 1 week.        Amount of time spent w/patient today: 45 minutes      Darlys Gales, LICSW

## 2011-08-19 ENCOUNTER — Ambulatory Visit (HOSPITAL_BASED_OUTPATIENT_CLINIC_OR_DEPARTMENT_OTHER): Payer: PRIVATE HEALTH INSURANCE

## 2011-08-19 DIAGNOSIS — F339 Major depressive disorder, recurrent, unspecified: Principal | ICD-10-CM

## 2011-08-19 NOTE — Progress Notes (Signed)
PSYCHIATRY OUTPATIENT PROGRESS NOTE    VISIT TYPE: Psychotherapy         PROBLEMS which this visit addressed:   Problem 1: chronic depression  Problem 2: indecision about return to [place]         SOURCE(S) OF INFORMATION:  Patient     SUBJECTIVE FINDINGS:  "I have taken a few positive steps since last week. I have asked my friend about moving in with him, but he's not sure when his roommate will move out. I really know that I need to make some changes. I feel really lonely here in the [place]. My brother and former sis-in-law are here, but my brother travels a lot. I really want to meet someone and start a family. Because I came through [place], it would take years to get my legal status here. Either way I'd have to wait in [place]. I've been here 12 years and it feels like it's time to go back home. What holds me back is fear: I keep remembering my life there when I did not have work. That's not the situation now, but it's my negatie thinking."                                                    OBJECTIVE FINDINGS:   Appearance: Casually dressed, well groomed   Behavior: Cooperative   Alertness: Alert   Speech: Normal rate and volume   Mood: Sad, worried   Affect: full range  Thought Process: logical, linear   Thought Content: No delusional thinking   Perceptions: Denied Hallucinations   Judgment/Impulse Control: Good/Good   Insight: Fair   Cognition: Intact   Suicidal/Homicidal: Denied      Signs and symptoms: isolates self, although he did go to party on the weekend. Does not get out of bed on days that he is home. Extremely low motivation. Isolated other than going to work a few days a week. Has roommate but does not interact much with him. Shares meals with sister-in-law who lives upstairs but otherwise has little social contact    Testing results:  No test results pending.        Risk behaviors: None reported.        ASSESSMENT:  Clinical formulation:    Patient is a 36 year old, single [place] man who has been  struggling with marked symptoms of depression and anxiety for the last 3 years in the context of break up with [place]  girlfriend. During this session, he was engaged with therapist and denied safety issues. He is moving towards making a decision about moving back to [place]. He will see PCP and consider re-starting fluoxetine which was helpful in past. He will meet with Dr. Doreatha Martin next month. He will book tickets back to [place] for September. And will consider doing some complementary treatments such as accupuncture, yoga, meditation.      Clinical interventions today and patient's response: Discussed pros and cons of staying in [place]. 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 Doreatha Martin 09/23/11. Discussed use of PES if symptoms increase or if he has any safety concerns     Risk plan (for patients at moderate/high risk for suicide/violence/addiction): Patient not at moderate or high risk.    Next visit: patient to be seen in 1 week.        Amount of time spent w/patient today: 45 minutes      Darlys Gales, LICSW

## 2011-08-26 NOTE — Progress Notes (Signed)
Chart review  Has seen therapist

## 2011-09-08 ENCOUNTER — Ambulatory Visit (HOSPITAL_BASED_OUTPATIENT_CLINIC_OR_DEPARTMENT_OTHER): Payer: PRIVATE HEALTH INSURANCE | Admitting: Family Medicine

## 2011-09-23 ENCOUNTER — Ambulatory Visit (HOSPITAL_BASED_OUTPATIENT_CLINIC_OR_DEPARTMENT_OTHER): Payer: PRIVATE HEALTH INSURANCE

## 2011-09-24 ENCOUNTER — Ambulatory Visit (HOSPITAL_BASED_OUTPATIENT_CLINIC_OR_DEPARTMENT_OTHER): Payer: PRIVATE HEALTH INSURANCE

## 2011-10-15 ENCOUNTER — Ambulatory Visit (HOSPITAL_BASED_OUTPATIENT_CLINIC_OR_DEPARTMENT_OTHER): Payer: PRIVATE HEALTH INSURANCE

## 2011-11-06 ENCOUNTER — Telehealth (HOSPITAL_BASED_OUTPATIENT_CLINIC_OR_DEPARTMENT_OTHER): Payer: Self-pay

## 2011-11-06 ENCOUNTER — Ambulatory Visit (HOSPITAL_BASED_OUTPATIENT_CLINIC_OR_DEPARTMENT_OTHER): Payer: PRIVATE HEALTH INSURANCE

## 2011-11-06 DIAGNOSIS — F339 Major depressive disorder, recurrent, unspecified: Principal | ICD-10-CM

## 2011-11-06 MED ORDER — BUPROPION HCL 100 MG PO TABS
ORAL_TABLET | ORAL | Status: DC
Start: 2011-11-06 — End: 2011-12-25

## 2011-11-06 MED ORDER — TRAZODONE HCL 50 MG PO TABS
50.0000 mg | ORAL_TABLET | Freq: Every evening | ORAL | Status: DC
Start: 2011-11-06 — End: 2012-02-24

## 2011-11-06 NOTE — Progress Notes (Signed)
ADULT PSYCHIATRY INITIAL EVALUATION      CHIEF COMPLAINT: "I have been feeling very down"    HISTORY of PRESENT ILLNESS: 42 year single [place] male, employed in [employer] and has been missing work due to the loss of motivation. Has been trying to go once or twice per week and struggling to do so; used to work all week without difficulty.     Pt reports having been feeling down, unmotivated and losing energy and drive. Appetite is intact. Concentration is poor; reports forgetfulness. Some anhedonia. Gets irritated easily. Sleep is poor; insomnia + and early awakening.     Has tried various medications by PCP but did not respond as he hoped.    In 2010 his girlfriend of 3 years had to go back to her country [place] (acculturation); this was a major loss for him and took about a year to start to get over it although has not been dating again.     Pt hopes to be able to go back to [place] but is afraid of this being a bad choice and later on regret it; this is causing some conflict. The memories of his time in [place] when he had no employment and no clear future have been emerging more and more as he tries to make this decision.    CURRENT MEDICATIONS: none      Past Medications:   Fluoxetine 20 mg: in 2008, 2009 and 2011; partial response  Venlafaxine 75 mg: in 2011-2012, possibly more depressed while on it  Sertraline 50 mg: April 2013; stopped due to poor response    CURRENT TREATMENT: PCP     System Involvement: none    PAST PSYCHIATRIC HISTORY:   Consulted with therapist Darlys Gales twice.  Had episode of depression in [place] in his teens; untreated; improved when his work situation improved.  No hx of suicide attempts.  No hx of mania or hypomania.  No hx of psychosis.     SUBSTANCE USE: None. Seldom alcohol use.     Family Constellation: brother in [place], rest of siblings in [place]    Biological Family History:  Sister - depression, took medication for 6 months (fluoxetine)  Mother - died from  AMI  Father - died at old age, cause?    CURRENT LIVING SITUATION/CURRENT SUPPORTS: lives with his brother in a basement with a small window    Social History: born in [place]; pt is youngest of 4. Reports a difficult time in his teens. 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 His brother was already living here. 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 Pt enjoys going to the gym but lately has not been able to go much.    Trauma History: none    MEDICAL HISTORY: anal fissure, allergic rhinits        MENTAL STATUS EXAM:  Appearance: well built well kempt, dressed and groomed male  Behavior: very pleasant, cooperative with good eye contact and social smile, spontaneous. No psychomotor agitation or retardation. No tremors or abnormal movements.  Alertness:  Fully alert   Speech:  Clear, with normal rate, volume and rhythm  Mood: 'depressed'  Affect: Congruent, sad baseline and full range  Thought Process: logical, linear, goal directed  Thought Content:  Motivated and hopeful. Conflicted about decision to return or not to [place]   Perceptions:  none  Judgment/Impulse Control: good/good    Insight:  good  Cognition: grossly intact  Suicidal/Homicidal: none/none      ASSESSMENT: young [place] male with genetic load for depression and personal hx of a prolonged depressed episode with partial response only in the past to three trials of low dose antidepressant (SSRI, SNRI); will target his depression and prominent loss of drive with bupropion; once improved pt can regain self esteem and confidence and likely will be able to resolve his dilemma of whether or not to return to [place]. This is pt's second depressed episode with first one in his teens that went  untreated.    DIAGNOSES:  Axis I (primary): MDD, recurrent, moderate  Axis II: deferred  Axis III:  anal fissure, allergic rhinits  Axis IV: limited supports, loss of relationship 3 yr ago, living in basement, limited supports  Axis V (current):  57  Axis V (highest in past year): 60    RISK ASSESSMENT (per scale):  Suicide: 1  Violence: 1  Addiction: 1    PLAN:   - cont seeing in C/L clinic, once stabilizing return to PCP care  - stop sertraline, pt had stopped  - start bupropion titrating to 200 mg; titrate further as needed  - consider indiv therapy if conflict not resolving  - rtc in 6 wk or earlier as needed    Moises Blood, MD

## 2011-11-06 NOTE — Progress Notes (Signed)
Pt met with Dr. Doreatha Martin today and was prescribed new medication.  SW called to ask if he wants to schedule another therapy session.  Pt said that he wants to see how helpful the new medication will be. If th medication is really helpful, he does not want therapy. He will call SW if he wants to resume therapy.

## 2011-12-25 ENCOUNTER — Ambulatory Visit (HOSPITAL_BASED_OUTPATIENT_CLINIC_OR_DEPARTMENT_OTHER): Payer: PRIVATE HEALTH INSURANCE

## 2011-12-25 DIAGNOSIS — F339 Major depressive disorder, recurrent, unspecified: Secondary | ICD-10-CM

## 2011-12-25 DIAGNOSIS — F4010 Social phobia, unspecified: Principal | ICD-10-CM

## 2011-12-25 MED ORDER — PAROXETINE HCL 20 MG PO TABS
ORAL_TABLET | ORAL | Status: DC
Start: 2011-12-25 — End: 2012-02-24

## 2011-12-25 MED ORDER — PROPRANOLOL HCL 20 MG PO TABS
ORAL_TABLET | ORAL | Status: DC
Start: 2011-12-25 — End: 2012-04-26

## 2011-12-25 NOTE — Progress Notes (Signed)
PSYCHIATRY OUTPATIENT PROGRESS NOTE    VISIT TYPE: Psychopharmacology        PROBLEMS which this visit addressed:   Problem 1: depression        SOURCE(S) OF INFORMATION:  Patient     SUBJECTIVE FINDINGS:  Started bupropion and has not noticed major improvement on it. Pt continues ruminating hid dilemma about returning [place]; perhaps more than prior to bupropion. Reportsn having felt more anxious, worried and the use of this medication has not helped much with his mood.     Pt continues feeling down and sad. Asked about his thoughts of going back to a small town in [place] he comments that meeting a girl and maybe having a family is the main reason; asked about the reasons why that would not happen here he opens up about his difficulties relating to people as result of anxiety; avoids these situations even though he wishes he could attend more and feel comfortable on them; particularly as it relates his symptoms worsen; at his last date some months he was so anxious he could note even think of what to say; he wishes it was possible for him to overcome this problem.     No safety concerns.    Sleeps well most of the time; in occasion needs trazodone which has good effect when used.                                                    MENTAL STATUS EXAM:  Appearance: well built well kempt, dressed and groomed male  Behavior: very pleasant, cooperative with good eye contact and social smile, spontaneous. No psychomotor agitation or retardation. No tremors or abnormal movements.  Alertness:  Fully alert   Speech:  Clear, with normal rate, volume and rhythm  Mood: 'depressed, anxious'  Affect: Congruent, sad baseline and full range  Thought Process: logical, linear, goal directed  Thought Content:  Motivated and hopeful. Conflicted about decision to return or not to [place]   Perceptions:  none  Judgment/Impulse Control: good/good    Insight:  good  Cognition: grossly intact  Suicidal/Homicidal: none/none      Current  Outpatient Prescriptions on File Prior to Visit:  buPROPion (WELLBUTRIN) 100 MG tablet One daily for one week, then increase to 2 daily. PLEASE FILL TODAY Disp: 60 tablet Rfl: 2   trazodone (DESYREL) 50 MG tablet Take 1 tablet by mouth nightly. PLEASE FILL TODAY Disp: 30 tablet Rfl: 2     No current facility-administered medications on file prior to visit.         Medications taken as prescribed (n/a for psychotherapy only visits): Yes    Medication side effects :  Fluoxetine 20 mg: in 2008, 2009 and 2011; partial response  Venlafaxine 75 mg: in 2011-2012, possibly more depressed while on it  Sertraline 50 mg: April 2013; stopped due to poor response  Bupropion - anxiety, worsened rumination    Testing results:  No test results pending.        Risk behaviors: None reported.          ASSESSMENT: young [place] male with genetic load for depression and personal hx of a prolonged depressed episode with partial response only in the past to three trials of low dose antidepressant (SSRI, SNRI); will target his depression and prominent loss of drive with bupropion;  once improved pt can regain self esteem and confidence and likely will be able to resolve his dilemma of whether or not to return to [place]. This is pt's second depressed episode with first one in his teens that went untreated. Driving force under his depression is chronic problems with social anxiety; pt has likely been idealizing his return to [place] a a way to overcome the difficulties he encounters here as result of this anxiety but is highly likely that he would suffer the same symptoms there as the barrier is more internal than external.    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

## 2012-01-07 ENCOUNTER — Ambulatory Visit (HOSPITAL_BASED_OUTPATIENT_CLINIC_OR_DEPARTMENT_OTHER): Payer: PRIVATE HEALTH INSURANCE

## 2012-01-07 DIAGNOSIS — F4010 Social phobia, unspecified: Principal | ICD-10-CM

## 2012-01-07 NOTE — Progress Notes (Signed)
PSYCHIATRY OUTPATIENT PROGRESS NOTE    VISIT TYPE: Psychotherapy         PROBLEMS which this visit addressed:   Problem 1: chronic depression  Problem 2: indecision about return to [place]         SOURCE(S) OF INFORMATION:  Patient     SUBJECTIVE FINDINGS:  "I have tried a few medications but nothing really helped. I started Paxil 11 days ago. I felt light-headed the first week but now I feel fine. How long does it take to get the benefit? I have noticed erectile dysfunction. Will that go away once I'm off treatment?"  "I continue to be tortured about my return to [place]. I have so much fear about going back. My brother is going back in December and encourages me to go back, too. He is [place] citizen and a Clinical research associate told me that he could get a green card for me but it would take 8-9 years. And I'd have to apply from [place] anyway. I feel too fearful and full of doubt to make a decision now. I have terrible insomnia and wake up at night, worrying about this decision. I am often too tired to work. I used to go out when I had the [place] girlfriend, but since then I have let my social connections go. I am full of self-doubt and indecision. I feel quite terrible about myself."                                               OBJECTIVE FINDINGS:   Appearance: Casually dressed, well groomed. Very dark circles under his eyes   Behavior: Cooperative, good eye contact   Alertness: Alert   Speech: Normal rate and volume . Fluent in [place]  Mood: Sad, worried   Affect: full range  Thought Process: logical, linear   Thought Content: No delusional thinking   Perceptions: Denied Hallucinations   Judgment/Impulse Control: Good/Good   Insight: Fair   Cognition: Intact   Suicidal/Homicidal: Denied      Signs and symptoms: isolates himself. Isolated other than going to work a few days a week. Still living in basement apartment with little natural light    Testing results:  No test results pending.        Risk behaviors: None reported.         ASSESSMENT:  Clinical formulation:  Patient is a 36 year old, single [place] man who has been struggling with marked symptoms of depression and anxiety for the last 3 years in the context of break up with [place]  girlfriend. He needs to make a decision about moving back to [place]. He left there 9 years ago, just 2 months after his mother died. He had a very hard time there the last 4 years and is fearful of going back to a bad situation, even though he says that his life circumstances would be much improved. We explored some complementary treatments such as accupuncture, yoga, meditation.      Clinical interventions today and patient's response: Discussed pros and cons of staying in [place]. 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 Moises Blood    Risk plan (for patients at moderate/high risk for suicide/violence/addiction): Patient not at moderate or high risk.    Next visit: patient to be seen in 3 week.        Amount of time spent w/patient today: 45 minutes      Darlys Gales, LICSW

## 2012-01-21 ENCOUNTER — Ambulatory Visit (HOSPITAL_BASED_OUTPATIENT_CLINIC_OR_DEPARTMENT_OTHER): Payer: PRIVATE HEALTH INSURANCE

## 2012-02-10 ENCOUNTER — Ambulatory Visit (HOSPITAL_BASED_OUTPATIENT_CLINIC_OR_DEPARTMENT_OTHER): Payer: PRIVATE HEALTH INSURANCE

## 2012-02-10 DIAGNOSIS — F339 Major depressive disorder, recurrent, unspecified: Principal | ICD-10-CM

## 2012-02-10 NOTE — Progress Notes (Signed)
PSYCHIATRY OUTPATIENT PROGRESS NOTE    VISIT TYPE: Psychotherapy         PROBLEMS which this visit addressed:   Problem 1: chronic depression  Problem 2: indecision about return to [place]       Problem 3: social phobia    SOURCE(S) OF INFORMATION:  Patient     SUBJECTIVE FINDINGS:  "I have had no improvement on Paxil and it has killed my libido. I think I will discontinue. I hope that Dr. Doreatha Martin can try me on another medication to treat the depression. The Paxil also made me feel more anxious. I am tormented by negative thoughts. I really want and need to make changes in my life, but lack self-confidence. I was introduced to a nice woman from [place] recently, but have not asked her out yet. All I can imagine is that she will judge me for being an immigrant, for not having much money, for living in a terrible apartment. I can see that my thoughts stop me in so many ways. I have a good boss who would offer me more hours but I don't have the motivation to work more than the minimum to pay my bills. And I am paralyzed about whether to return to [place]. My niece tells me that I should start over in a new region. The thought of moving back into the house where my parents lived is depressing. My brother is going back next week and encourages me to go back, too. He is [place] citizen and a Clinical research associate told me that he could get a green card for me but it would take 8-9 years; he is filing that paperwork now. And I'd have to apply from [place] anyway. I feel too fearful and full of doubt to make a decision now. I have terrible insomnia and wake up at night, worrying about this decision. I am often too tired to work. I am full of self-doubt and indecision. I feel quite terrible about myself."                                               OBJECTIVE FINDINGS:   Appearance: Casually dressed, well groomed. Very dark circles under his eyes. Reports sleeping 4-5 hours per night   Behavior: Cooperative, good eye contact   Alertness: Alert    Speech: Normal rate and volume . Fluent in [place]  Mood: Sad, worried   Affect: full range  Thought Process: logical, linear   Thought Content: No delusional thinking   Perceptions: Denied Hallucinations   Judgment/Impulse Control: Good/Good   Insight: Fair   Cognition: Intact   Suicidal/Homicidal: Denied      Signs and symptoms: isolates himself although has been going out more lately. Goes to gym 3-4 times/week which is the best part of his life. Still living in basement apartment with little natural light. He hates where he lives but it's cheap and brother owns the house.    Testing results:  No test results pending.        Risk behaviors: None reported.        ASSESSMENT:  Clinical formulation:  Patient is a 36 year old, single [place] man who has been struggling with marked symptoms of depression and anxiety for the last 3 years in the context of break up with [place]  girlfriend. He needs to make a decision about moving  back to [place]. He left there 12 years ago, just 2 months after his mother died. He had a very hard time there the last 4 years and is fearful of going back to a bad situation, even though he says that his life circumstances would be much improved. We explored some complementary treatments such as accupuncture, yoga, meditation.      Clinical interventions today and patient's response: Discussed learning how to identify/name his negative thoughts and to question whether they are actually true, ie women will reject him. 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 Moises Blood    Risk plan (for patients at moderate/high risk for suicide/violence/addiction): Patient not at moderate or high risk.    Next visit: patient to be seen in 3 week.        Amount of time spent w/patient today: 45 minutes      Darlys Gales, LICSW

## 2012-02-24 ENCOUNTER — Ambulatory Visit (HOSPITAL_BASED_OUTPATIENT_CLINIC_OR_DEPARTMENT_OTHER): Payer: PRIVATE HEALTH INSURANCE

## 2012-02-24 DIAGNOSIS — F4010 Social phobia, unspecified: Principal | ICD-10-CM

## 2012-02-24 MED ORDER — TRAZODONE HCL 50 MG PO TABS
50.0000 mg | ORAL_TABLET | Freq: Every evening | ORAL | Status: DC
Start: 2012-02-24 — End: 2012-04-26

## 2012-02-24 MED ORDER — BUSPIRONE HCL 10 MG PO TABS
ORAL_TABLET | ORAL | Status: DC
Start: 2012-02-24 — End: 2012-04-26

## 2012-02-24 NOTE — Progress Notes (Signed)
PSYCHIATRY OUTPATIENT PROGRESS NOTE    VISIT TYPE: Psychopharmacology        PROBLEMS which this visit addressed:   Problem 1: depression        SOURCE(S) OF INFORMATION:  Patient     SUBJECTIVE FINDINGS:  Stopped paroxetine after 6 wk due to sexual side effects and lack of response. After stopping it the loss of libido resolved.    Mood remains low mostly as result of ongoing conflict in whether or not deciding to return to [place]; not anhedonic; enjoys going to the gym and has fair energy and concentration. Still hesitant of social interaction; tried propranolol 20 mg twice with some effect. Sleep is suboptimal, about 5 hours total; uses trazodone sometimes (50 mg) but then the next morning feels tired and sedated for some hours.   to say; he wishes it was possible for him to overcome this problem.     No safety concerns.                                MENTAL STATUS EXAM:  Appearance: well built well kempt, dressed and groomed male  Behavior: very pleasant, cooperative with good eye contact and social smile, spontaneous. No psychomotor agitation or retardation. No tremors or abnormal movements.  Alertness:  Fully alert   Speech:  Clear, with normal rate, volume and rhythm  Mood: 'same'  Affect: Congruent, sad baseline and full range  Thought Process: logical, linear, goal directed  Thought Content:  Motivated and hopeful. Conflicted about decision to return or not to [place]   Perceptions:  none  Judgment/Impulse Control: good/good    Insight:  good  Cognition: grossly intact  Suicidal/Homicidal: none/none      Current Outpatient Prescriptions on File Prior to Visit:  paroxetine (PAXIL) 20 MG tablet One at night for 2 weeks, then 2 at night Disp: 60 tablet Rfl: 3   propranolol (INDERAL) 20 MG tablet 1-2 dailty as needed 1 hour prior to anxiety provoking situation Disp: 60 tablet Rfl: 3     No current facility-administered medications on file prior to visit.         Medications taken as prescribed (n/a for  psychotherapy only visits): Yes    Medication side effects :  Fluoxetine 20 mg: in 2008, 2009 and 2011; partial response  Venlafaxine 75 mg: in 2011-2012, possibly more depressed while on it  Sertraline 50 mg: April 2013; stopped due to poor response  Bupropion - anxiety, worsened rumination  Paroxetine - sexual side effects (low libido)    Testing results:  No test results pending.        Risk behaviors: None reported.          ASSESSMENT: young [place] male with genetic load for depression and personal hx of a prolonged depressed episode with partial response only in the past to three trials of low dose antidepressant (SSRI, SNRI); will target his depression and prominent loss of drive with bupropion; once improved pt can regain self esteem and confidence and likely will be able to resolve his dilemma of whether or not to return to [place]. This is pt's second depressed episode with first one in his teens that went untreated. Driving force under his depression is chronic problems with social anxiety; pt has likely been idealizing his return to [place] a a way to overcome the difficulties he encounters here as result of this anxiety but is highly likely that he  would suffer the same symptoms there as the barrier is more internal than external.    Suffering from side effects on various medication trials; pt willing to favor a medication that can help reduce his anxiety with minimal side effects, particularly sexual ones; we agreed on a trial of buspirone and depending on response may add escitalopram later on.    DIAGNOSES:  Axis I (primary): Social anxiety; MDD, recurrent, moderate  Axis II: deferred  Axis III:  anal fissure, allergic rhinits  Axis IV: limited supports, loss of relationship 3 yr ago, living in basement, limited supports  Axis V (current):  58  Axis V (highest in past year): 60    RISK ASSESSMENT (per scale):  Suicide: 1  Violence: 1  Addiction: 1    PLAN:   - cont seeing in C/L clinic, once  stabilizing return to PCP care  - stop paroxetine due to sexual side effects and lack of response  - start buspirone titrating initially to 15 mg bid  - propranolol 20-40 mg dily prn anxiety provoking event  - trazodone 25-57m g qhs prn  - consider adding escitalopram 5 mg  - resume therapy with Darlys Gales for social phobia    - rtc in 6 wk or earlier as needed    Moises Blood, MD

## 2012-03-09 ENCOUNTER — Ambulatory Visit (HOSPITAL_BASED_OUTPATIENT_CLINIC_OR_DEPARTMENT_OTHER): Payer: PRIVATE HEALTH INSURANCE

## 2012-04-26 ENCOUNTER — Telehealth (HOSPITAL_BASED_OUTPATIENT_CLINIC_OR_DEPARTMENT_OTHER): Payer: Self-pay

## 2012-04-26 ENCOUNTER — Ambulatory Visit (HOSPITAL_BASED_OUTPATIENT_CLINIC_OR_DEPARTMENT_OTHER): Payer: PRIVATE HEALTH INSURANCE

## 2012-04-26 DIAGNOSIS — F339 Major depressive disorder, recurrent, unspecified: Principal | ICD-10-CM

## 2012-04-26 MED ORDER — TRAZODONE HCL 50 MG PO TABS
50.0000 mg | ORAL_TABLET | Freq: Every evening | ORAL | Status: DC
Start: 2012-04-26 — End: 2013-07-01

## 2012-04-26 MED ORDER — PROPRANOLOL HCL 20 MG PO TABS
ORAL_TABLET | ORAL | Status: DC
Start: 2012-04-26 — End: 2012-11-02

## 2012-04-26 MED ORDER — ESCITALOPRAM OXALATE 10 MG PO TABS
10.0000 mg | ORAL_TABLET | Freq: Every day | ORAL | Status: DC
Start: 2012-04-26 — End: 2012-11-02

## 2012-04-26 NOTE — Progress Notes (Addendum)
Prior authorization request for Escitalopram 20 mg daily (starting with 10 mg)  Clinical Indication: major depression  Previous medications tried:   Fluoxetine 20 mg: in 2008, 2009 and 2011; partial response  Venlafaxine 75 mg: in 2011-2012, possibly more depressed while on it  Sertraline 50 mg: April 2013; stopped due to poor response  Bupropion - anxiety, worsened rumination  Paroxetine - sexual side effects (low libido)  Diagnostic testing information: clinical depression failing to respond or tolerate various medications. Side effect profile of lexapro and indication makes it the next best choice for this patient

## 2012-04-26 NOTE — Progress Notes (Signed)
Prior Authorization for Escitalopram 20 mg is not required by [employer].      A new prescription can be sent to patient's preferred pharmacy, [facility] outpatient pharmacy did not receive prescription Marked Normal in EPIC from 04/26/12.

## 2012-04-26 NOTE — Progress Notes (Signed)
PSYCHIATRY OUTPATIENT PROGRESS NOTE    VISIT TYPE: Psychopharmacology        PROBLEMS which this visit addressed:   Problem 1: depression        SOURCE(S) OF INFORMATION:  Patient     SUBJECTIVE FINDINGS:  Use of buspirone not effective for anxiety and mood continues low and depressed; described low self-esteem, apathy and poor confidence. Feels uncertain of what to do with his life and feels stuck unable to make decisions. Not fully anhedonic; enjoys going to the gym. Still hesitant of social interaction; tried propranolol 20 mg twice with partial effect but lately has not been socializing much as he finds himself more isolative. Sleeps better with trazodone which he uses as prn.      No safety concerns.                                MENTAL STATUS EXAM:  Appearance: well built well kempt, dressed and groomed male  Behavior: very pleasant, cooperative with good eye contact and social smile, spontaneous. No psychomotor agitation or retardation. No tremors or abnormal movements.  Alertness:  Fully alert   Speech:  Clear, with normal rate, volume and rhythm  Mood: 'same'  Affect: Congruent, sad baseline and full range  Thought Process: logical, linear, goal directed  Thought Content:  Motivated and hopeful. Conflicted about decision to return or not to [place]   Perceptions:  none  Judgment/Impulse Control: good/good    Insight:  good  Cognition: grossly intact  Suicidal/Homicidal: none/none      Current Outpatient Prescriptions on File Prior to Visit:  busPIRone (BUSPAR) 10 MG tablet haf bid x 5 days, then 1 bid x 5 days, then one and half bid Disp: 90 tablet Rfl: 3   traZODone (DESYREL) 50 MG tablet Take 1 tablet by mouth nightly. Half to one at night as needed Disp: 30 tablet Rfl: 3   propranolol (INDERAL) 20 MG tablet 1-2 dailty as needed 1 hour prior to anxiety provoking situation Disp: 60 tablet Rfl: 3     No current facility-administered medications on file prior to visit.      Medications taken as prescribed  (n/a for psychotherapy only visits): Yes    Medication side effects :  Fluoxetine 20 mg: in 2008, 2009 and 2011; partial response  Venlafaxine 75 mg: in 2011-2012, possibly more depressed while on it  Sertraline 50 mg: April 2013; stopped due to poor response  Bupropion - anxiety, worsened rumination  Paroxetine - sexual side effects (low libido)    Testing results:  No test results pending.        Risk behaviors: None reported.          ASSESSMENT: young [place] male with genetic load for depression and personal hx of a prolonged depressed episode with partial response only in the past to three trials of low dose antidepressant (SSRI, SNRI); will target his depression and prominent loss of drive with bupropion; once improved pt can regain self esteem and confidence and likely will be able to resolve his dilemma of whether or not to return to [place]. This is pt's second depressed episode with first one in his teens that went untreated. Driving force under his depression is chronic problems with social anxiety; pt has likely been idealizing his return to [place] a a way to overcome the difficulties he encounters here as result of this anxiety but is highly likely that he  would suffer the same symptoms there as the barrier is more internal than external.    Suffering from side effects on various medication trials; pt willing to favor a medication that can help reduce his anxiety with minimal side effects, particularly sexual ones; we agreed on a trial of escitalopram.    DIAGNOSES:  Axis I (primary): Social anxiety; MDD, recurrent, moderate  Axis II: deferred  Axis III:  anal fissure, allergic rhinits  Axis IV: limited supports, loss of relationship 3 yr ago, living in basement, limited supports  Axis V (current):  58  Axis V (highest in past year): 60    RISK ASSESSMENT (per scale):  Suicide: 1  Violence: 1  Addiction: 1    PLAN:   - d/c buspirone titrating due to lack of effect  - propranolol 20-40 mg dily prn  anxiety provoking event  - trazodone 25-64m g qhs prn  - start escitalopram 5 mg and increase to 10 mg after one week, PA written, rx given to pt in hand to wait until approval  - resume therapy with Darlys Gales for social phobia  - rtc in 4 wk or earlier as needed    Moises Blood, MD

## 2012-04-26 NOTE — Progress Notes (Signed)
Vm left to pt to notify that he can fill the rx

## 2012-04-28 ENCOUNTER — Telehealth (HOSPITAL_BASED_OUTPATIENT_CLINIC_OR_DEPARTMENT_OTHER): Payer: Self-pay

## 2012-04-28 NOTE — Progress Notes (Addendum)
SW reviewing chart while writing quarterly mental health treatment plan.  Pt met with psychiatrist Dr. Moises Blood yesterday.  He will start on a new anti-depressant (Lexapro) to target depression and social anxiety.    Last visit with SW was 02/10/12.  Pt says that he wants to wait to make another appt with SW until he sees if the new medication is helpful. No f/u scheduled with SW at this time per pt preference.

## 2012-06-28 ENCOUNTER — Ambulatory Visit (HOSPITAL_BASED_OUTPATIENT_CLINIC_OR_DEPARTMENT_OTHER): Payer: PRIVATE HEALTH INSURANCE

## 2012-06-28 DIAGNOSIS — F339 Major depressive disorder, recurrent, unspecified: Principal | ICD-10-CM

## 2012-06-28 MED ORDER — TADALAFIL 20 MG PO TABS
ORAL_TABLET | ORAL | Status: DC
Start: 2012-06-28 — End: 2014-03-20

## 2012-06-28 NOTE — Patient Instructions (Signed)
Maca: uma por dia    Para alergia ao polen: Loratadine 10 mg, uma por dia

## 2012-06-28 NOTE — Progress Notes (Signed)
PSYCHIATRY OUTPATIENT PROGRESS NOTE    VISIT TYPE: Psychopharmacology        PROBLEMS which this visit addressed:   Problem 1: depression        SOURCE(S) OF INFORMATION:  Patient     SUBJECTIVE FINDINGS:  Some improvement with escitalopram which he is tolerating well other than some side effects of sexual dysfunction. Using propranolol for socializing with some effect.     Pt is more optimistic and hopeful.    No new stressors.     No safety concerns.                                MENTAL STATUS EXAM:  Appearance: well built well kempt, dressed and groomed male  Behavior: very pleasant, cooperative with good eye contact and social smile, spontaneous. No psychomotor agitation or retardation. No tremors or abnormal movements.  Alertness:  Fully alert   Speech:  Clear, with normal rate, volume and rhythm  Mood: 'a little better'  Affect: Congruent, brighter  Thought Process: logical, linear, goal directed  Thought Content:  Motivated and hopeful. Conflicted about decision to return or not to [place]   Perceptions:  none  Judgment/Impulse Control: good/good    Insight:  good  Cognition: grossly intact  Suicidal/Homicidal: none/none      Current Outpatient Prescriptions on File Prior to Visit:  escitalopram (LEXAPRO) 10 MG tablet Take 1 tablet by mouth daily. Disp: 30 tablet Rfl: 3   propranolol (INDERAL) 20 MG tablet 1-2 dailty as needed 1 hour prior to anxiety provoking situation Disp: 60 tablet Rfl: 3   traZODone (DESYREL) 50 MG tablet Take 1 tablet by mouth nightly. Half to one at night as needed Disp: 30 tablet Rfl: 3     No current facility-administered medications on file prior to visit.      Medications taken as prescribed (n/a for psychotherapy only visits): Yes    Medication side effects :  Fluoxetine 20 mg: in 2008, 2009 and 2011; partial response  Venlafaxine 75 mg: in 2011-2012, possibly more depressed while on it  Sertraline 50 mg: April 2013; stopped due to poor response  Bupropion - anxiety, worsened  rumination  Paroxetine - sexual side effects (low libido)    Testing results:  No test results pending.        Risk behaviors: None reported.          ASSESSMENT: young [place] male with genetic load for depression and personal hx of a prolonged depressed episode with partial response only in the past to three trials of low dose antidepressant (SSRI, SNRI); will target his depression and prominent loss of drive with bupropion; once improved pt can regain self esteem and confidence and likely will be able to resolve his dilemma of whether or not to return to [place]. This is pt's second depressed episode with first one in his teens that went untreated. Driving force under his depression is chronic problems with social anxiety; pt has likely been idealizing his return to [place] a a way to overcome the difficulties he encounters here as result of this anxiety but is highly likely that he would suffer the same symptoms there as the barrier is more internal than external.    Suffering from side effects on various medication trials; pt willing to favor a medication that can help reduce his anxiety with minimal side effects, particularly sexual ones; we agreed on a trial of escitalopram.  DIAGNOSES:  Axis I (primary): Social anxiety; MDD, recurrent, moderate  Axis II: deferred  Axis III:  anal fissure, allergic rhinits  Axis IV: limited supports, loss of relationship 3 yr ago, living in basement, limited supports  Axis V (current):  60  Axis V (highest in past year): 60    RISK ASSESSMENT (per scale):  Suicide: 1  Violence: 1  Addiction: 1    PLAN:   - cont escitalopram 10 mg daily   - propranolol 20-40 mg dily prn anxiety provoking event  - trazodone 25-78m g qhs prn  - tadalafil prn  - may try maca to help with libido  - resume therapy with Darlys Gales for social phobia  - rtc in 4 wk or earlier as needed    Moises Blood, MD

## 2012-08-30 ENCOUNTER — Ambulatory Visit (HOSPITAL_BASED_OUTPATIENT_CLINIC_OR_DEPARTMENT_OTHER): Payer: PRIVATE HEALTH INSURANCE

## 2012-11-02 ENCOUNTER — Ambulatory Visit (HOSPITAL_BASED_OUTPATIENT_CLINIC_OR_DEPARTMENT_OTHER): Payer: PRIVATE HEALTH INSURANCE

## 2012-11-02 DIAGNOSIS — F339 Major depressive disorder, recurrent, unspecified: Secondary | ICD-10-CM

## 2012-11-02 DIAGNOSIS — F4010 Social phobia, unspecified: Principal | ICD-10-CM

## 2012-11-02 MED ORDER — ESCITALOPRAM OXALATE 20 MG PO TABS
ORAL_TABLET | ORAL | Status: DC
Start: 2012-11-02 — End: 2014-03-20

## 2012-11-02 MED ORDER — PROPRANOLOL HCL 20 MG PO TABS
ORAL_TABLET | ORAL | Status: DC
Start: 2012-11-02 — End: 2013-07-01

## 2012-11-17 ENCOUNTER — Other Ambulatory Visit (HOSPITAL_BASED_OUTPATIENT_CLINIC_OR_DEPARTMENT_OTHER): Payer: Self-pay

## 2013-01-26 ENCOUNTER — Encounter (HOSPITAL_BASED_OUTPATIENT_CLINIC_OR_DEPARTMENT_OTHER): Payer: Self-pay

## 2013-01-26 NOTE — Progress Notes (Signed)
PSYCHIATRY TERMINATION AND TRANSFER NOTE    Transfer Document    Date treatment started: first therapy with this SW was 08/03/11. Last session was 02/10/12.    Transfer/termination date: 01/26/13    Reason for treatment: pt was referred to this SW by Dr. Doreatha Martin for therapy    Treatment course (response to medications, compliance): We met 5 times. He continues on psychopharm    Outstanding Issues: still in active psychopharm treatment    Safe to refill: yes    Risk level: low    Plan: transfer treatment plan to Dr. Doreatha Martin    For transfers, the treatment plan will now be the responsibility of: Moises Blood

## 2013-06-09 ENCOUNTER — Other Ambulatory Visit (HOSPITAL_BASED_OUTPATIENT_CLINIC_OR_DEPARTMENT_OTHER): Payer: Self-pay

## 2013-06-09 NOTE — Telephone Encounter (Signed)
[facility] and [facility]  Patient needs to schedule appt with new PCP

## 2013-07-01 ENCOUNTER — Encounter (HOSPITAL_BASED_OUTPATIENT_CLINIC_OR_DEPARTMENT_OTHER): Payer: Self-pay

## 2013-07-01 ENCOUNTER — Ambulatory Visit (HOSPITAL_BASED_OUTPATIENT_CLINIC_OR_DEPARTMENT_OTHER): Payer: PRIVATE HEALTH INSURANCE

## 2013-07-01 DIAGNOSIS — F339 Major depressive disorder, recurrent, unspecified: Secondary | ICD-10-CM

## 2013-07-01 DIAGNOSIS — F4010 Social phobia, unspecified: Principal | ICD-10-CM

## 2013-07-01 MED ORDER — SILDENAFIL CITRATE 50 MG PO TABS
50.0000 mg | ORAL_TABLET | ORAL | Status: DC | PRN
Start: 2013-07-01 — End: 2018-11-29

## 2013-07-01 MED ORDER — PROPRANOLOL HCL 20 MG PO TABS
ORAL_TABLET | ORAL | Status: AC
Start: 2013-07-01 — End: 2013-09-30

## 2013-07-01 MED ORDER — TRAZODONE HCL 50 MG PO TABS
50.0000 mg | ORAL_TABLET | Freq: Every evening | ORAL | Status: DC
Start: 2013-07-01 — End: 2014-03-20

## 2013-07-01 MED ORDER — MIRTAZAPINE 15 MG PO TABS
ORAL_TABLET | ORAL | Status: DC
Start: 2013-07-01 — End: 2014-03-20

## 2013-07-01 NOTE — Progress Notes (Signed)
PSYCHIATRY OUTPATIENT PROGRESS NOTE    VISIT TYPE: Psychopharmacology        PROBLEMS which this visit addressed:   Problem 1: depression  Problem 2: social anxiety        SOURCE(S) OF INFORMATION:  Patient     SUBJECTIVE FINDINGS:  "I'm managing ok"    Pt stopped medication last November and has not noticed any significant difference off it.    Low libido improved after stopping the ssri.     Has been dating and met a girl she is stating to like; afraid to get injured like he did when he suffered the breakup a couple of years ago.    Has not tried taking propranolol; use of trazodone helps with sleep only partially.    Mood improved with some mild residual pessimism, often resulting from negativistic ruminative thinking    No safety concerns.                                MENTAL STATUS EXAM:  Appearance: well built well kempt, dressed and groomed male  Behavior: very pleasant, cooperative with good eye contact and social smile, spontaneous. No psychomotor agitation or retardation. No tremors or abnormal movements.  Alertness:  Fully alert   Speech:  Clear, with normal rate, volume and rhythm  Mood: 'ok'  Affect: Congruent, bright, a bit anxious  Thought Process: logical, linear, goal directed  Thought Content:  Motivated and hopeful.  Perceptions:  none  Judgment/Impulse Control: good/good    Insight:  good  Cognition: grossly intact  Suicidal/Homicidal: none/none      Current Outpatient Prescriptions on File Prior to Visit:  escitalopram (LEXAPRO) 20 MG tablet One daily Disp: 30 tablet Rfl: 3   propranolol (INDERAL) 20 MG tablet 1-2 dailty as needed 1 hour prior to anxiety provoking situation Disp: 60 tablet Rfl: 3   tadalafil (CIALIS) 20 MG tablet One tablet weekly prn Disp: 4 tablet Rfl: 2     No current facility-administered medications on file prior to visit.      Medications taken as prescribed (n/a for psychotherapy only visits): Yes    Medication side effects :  Fluoxetine 20 mg: in 2008, 2009 and 2011;  partial response  Venlafaxine 75 mg: in 2011-2012, possibly more depressed while on it  Sertraline 50 mg: April 2013; stopped due to poor response  Bupropion - anxiety, worsened rumination  Paroxetine - sexual side effects (low libido)  Escitalopram - ineffective  Buspirone - ineffective    Testing results:  No test results pending.        Risk behaviors: None reported.          ASSESSMENT: young [place] male with genetic load for depression and personal hx of a prolonged depressed episode with partial response only in the past to three trials of low dose antidepressant (SSRI, SNRI); will target his depression and prominent loss of drive with bupropion; once improved pt can regain self esteem and confidence and likely will be able to resolve his dilemma of whether or not to return to [place]. This is pt's second depressed episode with first one in his teens that went untreated. Driving force under his depression is chronic problems with social anxiety; pt has likely been idealizing his return to [place] a a way to overcome the difficulties he encounters here as result of this anxiety but is highly likely that he would suffer the same symptoms there as  the barrier is more internal than external.    Today's assessment: persistent anxiety and residual low mood; no response to various medication trials. WIll change to mirtazapine which may allow also to stop trazodone while referring pt for CBT.    DIAGNOSES:  Axis I (primary): Social anxiety; MDD, recurrent, moderate  Axis II: deferred  Axis III:  anal fissure, allergic rhinits  Axis IV: limited supports, loss of relationship 3 yr ago, living in basement, limited supports  Axis V (current):  60  Axis V (highest in past year): 60    RISK ASSESSMENT (per scale):  Suicide: 1  Violence: 1  Addiction: 1    PLAN:   -stopped escitalopram  - trial of mirtazapine 15 mg qhs  - will try also propranolol 20-40 mg dily prn anxiety provoking event  - trazodone 49mg qhs prn; if  mirtazapine helps with sleep, then hold its use  - sildenafil prn  - referring today to CBT  - rtc for psychopharm as needed    PRosezena Sensor MD

## 2013-08-01 ENCOUNTER — Ambulatory Visit (HOSPITAL_BASED_OUTPATIENT_CLINIC_OR_DEPARTMENT_OTHER): Payer: PRIVATE HEALTH INSURANCE

## 2014-03-20 ENCOUNTER — Encounter (HOSPITAL_BASED_OUTPATIENT_CLINIC_OR_DEPARTMENT_OTHER): Payer: Self-pay | Admitting: Family Medicine

## 2014-03-20 ENCOUNTER — Ambulatory Visit (HOSPITAL_BASED_OUTPATIENT_CLINIC_OR_DEPARTMENT_OTHER): Payer: PRIVATE HEALTH INSURANCE | Admitting: Family Medicine

## 2014-03-20 VITALS — BP 120/70 | HR 78 | Temp 98.2°F | Wt 217.0 lb

## 2014-03-20 DIAGNOSIS — F331 Major depressive disorder, recurrent, moderate: Secondary | ICD-10-CM

## 2014-03-20 DIAGNOSIS — Z23 Encounter for immunization: Secondary | ICD-10-CM

## 2014-03-20 DIAGNOSIS — H5203 Hypermetropia, bilateral: Principal | ICD-10-CM

## 2014-03-20 DIAGNOSIS — M25511 Pain in right shoulder: Secondary | ICD-10-CM

## 2014-03-20 LAB — LOW DENSITY LIPOPROTEIN DIRECT: LOW DENSITY LIPOPROTEIN DIRECT: 85 mg/dL (ref 0–189)

## 2014-03-20 MED ORDER — TRAZODONE HCL 50 MG PO TABS
ORAL_TABLET | ORAL | Status: DC
Start: 2014-03-20 — End: 2014-08-21

## 2014-03-20 MED ORDER — NAPROXEN 500 MG PO TABS
500.0000 mg | ORAL_TABLET | Freq: Two times a day (BID) | ORAL | Status: DC
Start: 2014-03-20 — End: 2018-11-29

## 2014-03-20 NOTE — Progress Notes (Signed)
Appropriate VIS given for immunization administered today.  See immunization\Injection module for date of publication and additional information.  Possible side effects reviewed.  Confort measures and\or contagion discussed, as applicable.  Parent and/or patient verbalized understanding.

## 2014-03-20 NOTE — Patient Instructions (Addendum)
Presbyopia  Presbyopia is when the ability to focus on close objects gets harder or is lost due to age. The ability to focus from near to far is called accommodation. Loss of accommodation is a normal aging process and not a disease. It usually begins when people approach their 7840s. It continues for several decades until the eye can not focus anymore. Presbyopia is one of the reasons why people need bifocals and reading glasses when they get older.  CAUSES   Aging.   Some medical conditions which cause problems seeing up close. These include:  1. High or low blood sugar.  2. Pregnancy.  3. Weakness of the eye muscles required to bring the eyes in while looking nearby (convergence).   Certain drugs and medicines (muscle relaxants, anti-depressants, ant-anxiety agents etc.)  SYMPTOMS    A hard time reading or seeing objects up close.   Achy feeling in the eyes while reading.   Headaches when reading or focusing up close.   Eye strain for activities that need focusing up close.  DIAGNOSIS    The diagnosis of this problem is easily made on exam by your optometrist or ophthalmologist.   You should have your eyes checked regularly throughout your life at yearly intervals.  TREATMENT    Presbyopia is treated with glasses or contact lenses.   Ophthalmologists can use painless laser treatments to change the shape of the cornea (the clear covering at the front of the eye). This will not help with close-up vision after a certain age unless the procedure is planned to give only one eye close vision. This should be discussed with the ophthalmologist before having surgery or a laser treatment. These types of approaches may affect the way the eyes work together.  Document Released: 02/08/2004 Document Revised: 08/01/2013 Document Reviewed: 05/19/2007  [facility] Patient Information 2015 [employer] [place]. This information is not intended to replace advice given to you by your health care provider. Make sure you discuss any  questions you have with your health care provider.     Index Spanish version Illustration Rehabilitation Exercises     Rotator Cuff Injury   What is a rotator cuff injury?   A rotator cuff injury is a strain or tear in the group of tendons and muscles that hold your shoulder joint together and help move your shoulder.  How does it occur?   A rotator cuff injury may result from:   poor head and shoulder posture, especially in older people    using your arm to break a fall    falling onto your arm    lifting a heavy object    normal wear and tear in an older person    use of your shoulder in sports with a repetitive overhead movement, such as swimming, baseball (mainly pitchers), football, and tennis, which gradually strains the tendon    painting, plastering, raking leaves, or housework   What are the symptoms?   The symptoms of a torn rotator cuff are:   arm and shoulder pain    shoulder weakness    shoulder tenderness    loss of shoulder movement, especially raising your arm overhead   How is it diagnosed?   Your healthcare provider will examine you and check your shoulder for pain, tenderness, and loss of motion as you move your arm in all directions. Your provider will ask if your shoulder pain began suddenly or gradually. You may have an X-ray to make sure there are not any  fractures or bone spurs.  Based on these results, you may have other tests or procedures right away or later, such as:   an MRI, which uses a magnetic field to create images of your shoulder and surrounding structures    an arthrogram, which is an X-ray or MRI that is taken after a special dye has been injected into your shoulder joint to outline its soft structures    an ultrasound, which shows the shoulder using sound waves    arthroscopy, a surgical procedure in which a small scope is inserted into your shoulder joint so your provider can look directly at your rotator cuff   What is the treatment?   A tendon in your shoulder  can be inflamed, partially torn, or completely torn. What is done about it depends on how torn it is and how much it hurts.  If your tear is minor, it can be left to heal by itself if it does not interfere with your everyday activities. To treat this condition:   Avoid strenuous activity or any overhead motion that causes pain.    Correct your posture so that your head and shoulders are balanced.    Put an ice pack, gel pack, or package of frozen vegetables, wrapped in a cloth on the area every 3 to 4 hours, for up to 20 minutes at a time.    Take an anti-inflammatory such as ibuprofen, or other medicine as directed by your provider. Nonsteroidal anti-inflammatory medicines (NSAIDs) may cause stomach bleeding and other problems. These risks increase with age. Read the label and take as directed. Unless recommended by your healthcare provider, do not take for more than 10 days.    Follow your providers instructions for doing exercises to help you recover. If you have a bad tear, you may need to have it repaired with arthroscopic surgery. After surgery, your treatment plan will include physical therapy to strengthen your shoulder as it heals.   How long will the effects last?   Full recovery depends on what is torn and how it is treated.  When can I return to my normal activities?  Everyone recovers from an injury at a different rate. Return to your activities depends on how soon your shoulder recovers, not by how many days or weeks it has been since your injury has occurred. In general, the longer you have symptoms before you start treatment, the longer it will take to get better. The goal of rehabilitation is to return you to your normal activities as soon as is safely possible. If you return too soon you may worsen your injury.  You may safely return to your normal activities when:   Your injured shoulder has full range of motion without pain.    Your injured shoulder has regained normal strength compared  to the uninjured shoulder.   What can be done to help prevent another rotator cuff injury?   The best way to prevent another injury is to strengthen your shoulder muscles and do shoulder exercises. Be careful when doing repetitive overhead activities.  Developed by [employer].  Adult Advisor 2012.1 published by [employer].  Last modified: 2008-12-06  Last reviewed: 2008-09-18  This content is reviewed periodically and is subject to change as new health information becomes available. The information is intended to inform and educate and is not a replacement for medical evaluation, advice, diagnosis or treatment by a healthcare professional.  References   Adult Advisor 2012.1 Index    2012 [employer] and/or  its affiliates. All rights reserved.

## 2014-03-20 NOTE — Progress Notes (Signed)
SUBJECTIVE:  38 year old male  presents for he has been having hard time for reading. Blurry if tries to read, needs to adjust distance. And watches t.v. And his eyes feel tired.   Far is fine, can see signs on the road, etc. No pain. No redness.  No d/c.  Right shoulder pain too, worse w/ lifting arm up like working, and with sleeping on that side.  Does contrctuion, plaster.  No trauma no injury.  Trying to avoid the overhead but part of job.  Also works out at gym.    Smoking Status: Never Smoker                      Alcohol Use: Yes           2.5 oz/week       5 Not specified per week       Comment: on weekends       Drug Use: No      Social History Narrative    From [place]. In [place] 2001.         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 But had a very tough time in [place] for about 5 years in the 90s - hard work, very busy, and mother died.    Seatb: yes    Ativities: goes to gym    Hobbies:not much , "i don't have much entertainment".         Family History    Diabetes      Comment: none    Heart Mother     Comment: MI age 40    Hypertension Brother     Cancer - Other      Comment: none         OBJECTIVE:  general: alert, well appearing, no distress    BP 120/70 mmHg   Pulse 78   Temp(Src) 98.2 F (36.8 C) (Temporal)   Wt 98.431 kg (217 lb)   SpO2 99%   Heent: perrl, fundoscopic wnl, no d/c, no erythema, eomi.   Neck: Supple, no mass, no lad   Extrem:  MS: Shoulder Exam  Side: right side   Inspection: no abnormal contours or bony prominences.   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 And  options  Plan: REFERRAL TO OPTOMETRY ( INT),   Written information provided and reviewed with patient/gaurdian.   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 discussed options  Plan: REFERRAL TO PHYSICAL THERAPY ( INT), naproxen         (NAPROSYN) 500 MG tablet,       Written information provided and reviewed with patient/gaurdian.   Letter written too    (Z23)  Need for prophylactic vaccination and inoculation against influenza  Comment: would like / agrees to  Plan: IMMUNIZATION ADMIN SINGLE, RN, PR INFLUENZA         VACCINE QUADRIVALENT 3 YRS PLUS IM (PRIVATE)          The pt understands and agrees with the plan.   Influenza Vaccine Procedure  March 20, 2014

## 2014-03-21 ENCOUNTER — Encounter (HOSPITAL_BASED_OUTPATIENT_CLINIC_OR_DEPARTMENT_OTHER): Payer: Self-pay | Admitting: Family Medicine

## 2014-03-21 LAB — HEMOGLOBIN A1C
ESTIMATED AVERAGE GLUCOSE: 103 (ref 74–160)
HEMOGLOBIN A1C: 5.2 % (ref 4.0–5.6)

## 2014-04-11 ENCOUNTER — Ambulatory Visit (HOSPITAL_BASED_OUTPATIENT_CLINIC_OR_DEPARTMENT_OTHER): Payer: PRIVATE HEALTH INSURANCE | Admitting: Ophthalmology

## 2014-04-11 DIAGNOSIS — H5203 Hypermetropia, bilateral: Secondary | ICD-10-CM

## 2014-04-11 DIAGNOSIS — H5200 Hypermetropia, unspecified eye: Secondary | ICD-10-CM | POA: Insufficient documentation

## 2014-04-11 DIAGNOSIS — H04123 Dry eye syndrome of bilateral lacrimal glands: Principal | ICD-10-CM

## 2014-04-11 MED ORDER — CARBOXYMETHYLCELLULOSE SODIUM 0.5 % OP SOLN
1.0000 [drp] | Freq: Four times a day (QID) | OPHTHALMIC | Status: AC | PRN
Start: 2014-04-11 — End: 2015-04-12

## 2014-04-11 NOTE — Nursing Note (Signed)
Uncomfortable in evening watching tv or staring at computer screen

## 2014-04-11 NOTE — Nursing Note (Signed)
Pt with blurry [facility] and near especially watching TV,computer and small print.    -injury    -surgery

## 2014-04-11 NOTE — Progress Notes (Signed)
Impression:  Dry eye symptoms  Hyperopia/Early presbyopia    Plan:  Tears OU prn irritation  Rx given for spectacle lenses if desired  Reassured of ocular health  See 2 years.

## 2014-05-15 ENCOUNTER — Ambulatory Visit (HOSPITAL_BASED_OUTPATIENT_CLINIC_OR_DEPARTMENT_OTHER): Payer: PRIVATE HEALTH INSURANCE | Admitting: Rehabilitative and Restorative Service Providers"

## 2014-05-15 DIAGNOSIS — M25511 Pain in right shoulder: Principal | ICD-10-CM | POA: Insufficient documentation

## 2014-05-15 NOTE — Progress Notes (Signed)
I certify that the documented Treatment Plan is reasonable and necessary.    05/15/2014  Vinnie LangtonKenneth Carrolyn Hilmes,MD

## 2014-05-15 NOTE — Progress Notes (Signed)
PT Treatment Flowsheet     05/15/14 1610   Language Information   Language of Care Portuguese   Interpreter No   Precautions   Precautions No   Other   Other (Comments) R shoulder impingement, supraspinatus tendinopathy   Rehab Discipline   Rehab Discipline PT   Visit   Visit number 1   POC Due date 06/26/14   Time Calculation   Start Time 1530   Stop Time 1615   Time Calculation (min) 45 min   Pain   Pain Score 6   (6/10 at worst)   Ther Exercise   Exercise HEP: pendulums   Holds 30 sec, throughout work day   Ther Exercise 2   Exercise HEP: doorway pec stretch   Reps 2 3   Holds 2 30 sec   Ther Exercise 3   Exercise 3 HEP: rows, blue theraband   Reps 3 20   Sets 3 2   Patient Education   What was taught? PT POC; HEP as above; avoiding or limiting overhead tasks as much as he can; use of ice for pain control   Method Verbal;Demo;Written   Patient comprehension Yes

## 2014-05-15 NOTE — Progress Notes (Signed)
PT Evaluation    HPI: Pt is a 38y/o R-handed male who presents to PT with R anterior shoulder pain starting 3-4 months ago which pt attributes to repetitive overhead tasks required as he works full-time as a Surveyor, minerals. 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 Pt reports pain is worse with lifting the R arm above shoulder height, moving into impingement type positions and laying on the R shoulder. Pt reports pain is better with rest and naproxen.     Pt reports he was exercising at the gym doing weight lifting exercises however had to stop due to R shoulder pain which was provoked by doing shoulder exercises: presses.   Pt denies previous h/o R shoulder pain or injury. Pt denies pain or paraesthesias radiating down R UE.     Pt's goal of PT is to decrease R shoulder pain at work and be able to return to weight lifting at the gym.      05/15/14 1610   Language Information   Language of Care Portuguese   Interpreter No   Evaluation Type   Evaluation Type Initial Evaluation   Rehab Discipline   Rehab Discipline PT   Visit   Visit number 1   POC Due date 06/26/14   Pain   Pain Score 6   (6/10 at worst)   Precautions   Precautions No   Other   Other (Comments) R shoulder impingement, supraspinatus tendinopathy   Patient Stated Goals   Patient stated goals decrease R shoulder pain while working; return to gym weight lifting   Posture   Posture assessment WFL   LUE Assessment   LUE Assessment X   LUE AROM (degrees)   L Shoulder Flexion  0-170 165 Degrees   L Shoulder Extension  0-60 50 Degrees   L Shoulder ABduction 0-40 155 Degrees   L Shoulder Internal Rotation  0-70 (T5)   L Shoulder External Rotation  0-90 60 Degrees   LUE Strength   L Shoulder Flexion 5/5   L Shoulder Extension 5/5   L Shoulder ABduction 5/5   L Shoulder Internal Rotation 5/5   L Shoulder External Rotation 5/5   Scapular MMT   MMT Mid Trap 4/5   MMT Lower Trap 4-/5   MMT Rhomboid 4+/5   MMT Serratus 4/5   RUE  Assessment   RUE Assessment X   RUE AROM (degrees)   R Shoulder Flexion  0-170 140 Degrees  (pain)   R Shoulder Extension  0-60 55 Degrees   R Shoulder ABduction 0-140 125 Degrees  (pain)   R Shoulder Internal Rotation  0-70 (T6)   R Shoulder External Rotation  0-90 65 Degrees   RUE PROM (degrees)   R Shoulder Flexion  0-170 165 Degrees   R Shoulder ABduction 0-140 165 Degrees   R Shoulder Internal Rotation  0-70 80 Degrees   R Shoulder External Rotation  0-90 70 Degrees   RUE Strength   R Shoulder Flexion 5/5  (min pain)   R Shoulder Extension 5/5   R Shoulder ABduction 5/5  (min pain)   R Shoulder Internal Rotation 5/5   R Shoulder External Rotation 5/5   Scapular MMT   MMT Lower Trap 4-/5  (pain)   MMT Mid Trap 4/5   MMT Rhomboid 4+/5   MMT Serratus 4/5   Clinical Special Tests   Special Tests Yes   Right Shoulder results   R Hawkins-Kennedy Positive   R  Full can  Positive  (min pain)   R Lift Off  Negative   R AC Shear Negative   R Empty Can Positive  (pain)   R Neer Positive   R Crank Negative   Sensation   Light Touch No apparent deficits   Functional Activities/Ergonomics   Functional Activities Assessment/Ergonomics X   Driving WNL   Recreation was going to gym 3x/wk with weight lifting, stopped 3 months ago due to R shoulder pain   Occupation full-time Surveyor, 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, etc   Other (comments) sleep: pain laying on R shoulder   Dressing Independent  (min pain overhead shirts)   Bathing Independent   Household Chores Independent  (min pain reaching high cabinets)   Palpation   Tenderness to Palpation TTP R supraspinatus and subacromial space   Increased Tissue Density min increased tension R supraspinatus tendon   UE Joint Mobility (0-6 scale)   UE Joint Mobility assessment Yes   Right Shoulder Glenohumeral   R Glenohumeral Anterior 3/6   R Glenohumeral Posterior 3/6   R Glenohumeral inferior 3/6   R Glenohumeral distraction 3/6   Right Shoulder Acromioclavicular   R Acromioclavicular  Ventrolateral 3/6   Left Shoulder Glenohumeral   L Glenohumeral Anterior 3/6   L Glenohumeral Posterior 3/6   L Glenohumeral inferior 3/6   L Glenohumeral distraction 3/6   Left Shoulder Acromioclavicular   L Acromioclavicular Ventrolateral 3/6   Patient Education   What was taught? 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 Exam findings suggest R subacromial impingement with R supraspinatus tendinopathy. Pt with decreased R shoulder AROM with painful end ranges into flexion and abduction. Pt with positive R subacromial impingement and supraspinatus special tests. Pt with decreased B scapula stabilizer strength. Pt is currently limited in his ability to perform overhead work tasks which pt reports he has to perform frequently: plastering and ceiling work. Pt has also stopped going to the gym (weight lifting) due to R shoulder pain. Pt will benefit from skilled PT to address impairments and educate pt on appropriate HEP and self-management skills to optimize pt's ability to perform occupational and recreational tasks without pain or limitation. The prescribed treatment plan of care is medically necessary.    Short Term Functional Goals: 4 weeks.   1. Pt will be compliant with HEP  2. Pt will have increased R shoulder AROM: R=L   3. Pt will be able to perform all exercises in clinic and HEP with <2/10 pain.     Long Term Goal: 6 weeks.   1. Pt will be independent with HEP.   2. Pt will have negative R  shoulder subacromial impingement special tests.   3. Pt will have increased B scapula stabilizer strength by 1/3 MMT grade.   4. Pt will be able to independently modify work tasks appropriately where he can.     Treatment Plan:  ** PT Eval (CPT 97001)  ** Stretching/ROM  Exercise (97110)  ** Therapeutic Exercise (CPT 97110)  ** Home Exercise Program (CPT [phone])  ** Neuromuscular Re-education (CPT [id number])  ** Soft Tissue Mobilization (CPT 97140)  ** Hot/Cold Rx (CPT 97010)  ** Functional Activities (CPT 97530)  ** Patient Education (CPT [phone])    Recommend Physical Therapy be continued 1 times per week for 6 weeks.  The rehabilitation potential for this patient is good    Patient Meryle Ready is aware of attendance policy: Yes  Plan of care discussed with Patient/Family: Yes  Patient goals reviewed and incorporated in plan of care: Yes  Patient/Family agrees with plan of care: Yes  Patient/Family education: Yes  Does patient feel safe at home: Yes    Charlyn Minerva PT, DPT

## 2014-05-22 ENCOUNTER — Ambulatory Visit (HOSPITAL_BASED_OUTPATIENT_CLINIC_OR_DEPARTMENT_OTHER): Payer: PRIVATE HEALTH INSURANCE | Admitting: Rehabilitative and Restorative Service Providers"

## 2014-05-22 DIAGNOSIS — M25511 Pain in right shoulder: Principal | ICD-10-CM

## 2014-05-22 NOTE — Progress Notes (Signed)
.  S:  Pt reports 0 pain at rest and pain increased to 5/10 when he lifts his arm. Pt asked about going to gym, and he used to lift #100.  O: See flow sheet  A:  Initiated MET for rows and pull downs.  Added planks and wall stretch to HEP.  Instructed pt to try low load (max 15 - 20) and reps of 20 instead of 10, only if he remains pain free. Pt verbalized understanding. Trial of k tape deltoid, U/M trap and AC joint tape; pt noted feeling supported from tape  P: Continue per PT POC        05/22/14 1536   Language Information   Language of Care Portuguese   Interpreter No   Precautions   Precautions No   Other   Other (Comments) R shoulder impingement, supraspinatus tendinopathy   Rehab Discipline   Rehab Discipline PT   Visit   Visit number 2   POC Due date 06/26/14   Time Calculation   Start Time 0255   Stop Time 0340   Time Calculation (min) 45 min   Pain   Pain Score 0   (5 when lifting arm)   Ther Exercise   Therapeutic Exercise? Yes   Exercise MET rows #9   Reps 25   Sets 3   Ther Exercise 2   Exercise MET CGPD #11.5   25x; #9 2 x 25   Ther Exercise 3   Exercise 3 pec stretch at wall   Reps 3 2   Holds 3 20"   Ther Exercise 4   Exercise 4 wall planks   Reps 4 10   Holds 4 5"   Ther Exercise 5   Exercise 5 k tape - see note   Ther Exercise 6   Exercise 6 posture ed   Modalities   Type of modalities Hot pack   Hot pack   Joints Right;Shoulder   Position Seated   Time in minutes 8'   Parameters pre treat   Cold pack   Joints Right;Shoulder   Position Supine   Time in minutes 10   Parameters post treat   Patient Education   What was taught? stay in pain free ROM at gym   Method Verbal   Patient comprehension Yes

## 2014-05-29 ENCOUNTER — Ambulatory Visit (HOSPITAL_BASED_OUTPATIENT_CLINIC_OR_DEPARTMENT_OTHER): Payer: PRIVATE HEALTH INSURANCE | Admitting: Rehabilitative and Restorative Service Providers"

## 2014-05-29 DIAGNOSIS — M25511 Pain in right shoulder: Principal | ICD-10-CM

## 2014-05-29 NOTE — Progress Notes (Signed)
S: "My shoulder is feeling ok when it's down, but when I lift it up I have pain." 0/10 pain at rest, 5/10 pain at full elevation  O: Refer to Rehabilitation Treatment Flowsheet   05/29/14 1505   Language Information   Language of Care Portuguese   Interpreter No   Precautions   Precautions No   Other   Other (Comments) R shoulder impingement, supraspinatus tendinopathy   Rehab Discipline   Rehab Discipline PT   Visit   Visit number 3   POC Due date 06/26/14   Time Calculation   Start Time 1500   Stop Time 1530   Time Calculation (min) 30 min   Pain   Pain Score 0   (0/10 when R arm at side, 5/10 when elevated)   Ther Exercise   Therapeutic Exercise? Yes   Ther Exercise 4   Exercise 4 MET R shoulder flex 3.5#  (seated on pball)   Reps 4 30   Sets 4 2   Ther Exercise 5   Exercise 5 MET R shoulder abduct 3.5#   (seated on pball)   Reps 5 30   Sets 5 2   Ther Exercise 6   Exercise 6 prone I's and T's 1#   Reps 6 10   Sets 6 3   Ther Exercise 7   Exercise 7 doorway rhomboid stretch   Reps 7 3   Holds 7 30 sec   Ther Exercise 8   Exercise 8 wall ball push ups   Reps 8 10   Sets 8 2   Ther Exercise 9   Exercise 9 wall walks RTB 10' x 4 lengths   Patient Education   What was taught? updated HEP    Method Verbal;Demo;Practice   Patient comprehension Yes     A: Pt required min VCs for proper scapula retraction/depression with prone I's and T's to prevent excess upper traps activation. Pt able to perform METs with decreased R shoulder pain following.   P: continue METs, and scapula stabilizer strengthening as tolerable

## 2014-06-05 ENCOUNTER — Ambulatory Visit (HOSPITAL_BASED_OUTPATIENT_CLINIC_OR_DEPARTMENT_OTHER): Payer: PRIVATE HEALTH INSURANCE | Admitting: Rehabilitative and Restorative Service Providers"

## 2014-06-12 ENCOUNTER — Ambulatory Visit (HOSPITAL_BASED_OUTPATIENT_CLINIC_OR_DEPARTMENT_OTHER): Payer: PRIVATE HEALTH INSURANCE | Admitting: Rehabilitative and Restorative Service Providers"

## 2014-06-12 DIAGNOSIS — M25511 Pain in right shoulder: Principal | ICD-10-CM

## 2014-06-12 NOTE — Progress Notes (Signed)
.     S:  Pt reports he only gets pain when lifting or if he rolls on his R shoulder during sleep  O: See flow sheet  A:  Initiated reverse flexion & scaption.  Pt fatigues during METs.  Pec stretches between sets of METs for posture correction.  P: Continue per PT POC    06/12/14 1603   Language Information   Language of Care Portuguese   Interpreter Patient Declined   Precautions   Precautions No   Other   Other (Comments) R shoulder impingement, supraspinatus tendinopathy   Rehab Discipline   Rehab Discipline PT   Visit   Visit number 4   POC Due date 06/26/14   Time Calculation   Start Time 1500   Stop Time 1530   Time Calculation (min) 30 min   Pain   Pain Score 0   (5/10 with activity or sleeping wrong)   Manual Therapy   Manual Therapy Yes   Technique STM/MFR R shoulder girdle   Time in minutes 8'   Ther Exercise   Therapeutic Exercise? Yes   Exercise UBE 2' fw, 3' bw   Ther Exercise 2   Exercise MET #3.5 flexion    Reps 2 20   Sets 2 2   Ther Exercise 3   Exercise 3 MET reverse flexion #3.5 on SB   Reps 3 20   Ther Exercise 4   Exercise 4 MET scaption #3.5 on SB   Reps 4 20   Sets 4 2   Ther Exercise 5   Exercise 5 MET reverse scaption #3.5   Reps 5 20   Ther Exercise 6   Exercise 6 pec stretch at wall   Reps 6 3   Holds 6 30"   Patient Education   What was taught? pec stretch   Method Verbal;Demo;Practice   Patient comprehension Yes

## 2014-06-19 ENCOUNTER — Ambulatory Visit (HOSPITAL_BASED_OUTPATIENT_CLINIC_OR_DEPARTMENT_OTHER): Payer: PRIVATE HEALTH INSURANCE | Admitting: Rehabilitative and Restorative Service Providers"

## 2014-06-22 ENCOUNTER — Telehealth (HOSPITAL_BASED_OUTPATIENT_CLINIC_OR_DEPARTMENT_OTHER): Payer: Self-pay

## 2014-06-22 NOTE — Progress Notes (Signed)
Unable to reach pt, left message to return nurse call to [facility], [phone]. Via interpreter.

## 2014-06-26 ENCOUNTER — Ambulatory Visit (HOSPITAL_BASED_OUTPATIENT_CLINIC_OR_DEPARTMENT_OTHER): Payer: PRIVATE HEALTH INSURANCE | Admitting: Rehabilitative and Restorative Service Providers"

## 2014-08-09 ENCOUNTER — Telehealth (HOSPITAL_BASED_OUTPATIENT_CLINIC_OR_DEPARTMENT_OTHER): Payer: Self-pay

## 2014-08-09 NOTE — Progress Notes (Signed)
I tried to contact patient to update his AWQ and his Care Plan. No answer. I left a message for him call me back at [phone].    Bobbe MedicoSandra Nashika Coker - Care Partner

## 2014-08-09 NOTE — Progress Notes (Signed)
.  I called patient to do his Care Plan. Care Plan and PHQ-9 done. Patient was taking medication but stopped taking it. He did not see any improvement on his depression.  He has an appointment with Dr. Billey GoslingGerweck on 08/21/14 to follow up and he would like to also sees me to explore his depression issues. We will work in behavioral activation.    Paul MedicoSandra Calob Baskette - Care Partner.

## 2014-08-21 ENCOUNTER — Ambulatory Visit (HOSPITAL_BASED_OUTPATIENT_CLINIC_OR_DEPARTMENT_OTHER): Payer: PRIVATE HEALTH INSURANCE | Admitting: Family Medicine

## 2014-08-21 ENCOUNTER — Ambulatory Visit (HOSPITAL_BASED_OUTPATIENT_CLINIC_OR_DEPARTMENT_OTHER): Payer: PRIVATE HEALTH INSURANCE

## 2014-08-21 ENCOUNTER — Encounter (HOSPITAL_BASED_OUTPATIENT_CLINIC_OR_DEPARTMENT_OTHER): Payer: Self-pay | Admitting: Family Medicine

## 2014-08-21 VITALS — BP 110/78 | HR 71 | Temp 97.9°F | Resp 16 | Wt 227.0 lb

## 2014-08-21 DIAGNOSIS — J301 Allergic rhinitis due to pollen: Secondary | ICD-10-CM

## 2014-08-21 DIAGNOSIS — F325 Major depressive disorder, single episode, in full remission: Secondary | ICD-10-CM

## 2014-08-21 DIAGNOSIS — Z0000 Encounter for general adult medical examination without abnormal findings: Secondary | ICD-10-CM

## 2014-08-21 DIAGNOSIS — F339 Major depressive disorder, recurrent, unspecified: Principal | ICD-10-CM

## 2014-08-21 DIAGNOSIS — M25511 Pain in right shoulder: Principal | ICD-10-CM

## 2014-08-21 MED ORDER — TRAZODONE HCL 50 MG PO TABS
ORAL_TABLET | ORAL | 0 refills | Status: DC
Start: 2014-08-21 — End: 2015-02-26

## 2014-08-21 MED ORDER — FLUTICASONE PROPIONATE 50 MCG/ACT NA SUSP
1.00 | Freq: Every day | NASAL | 12 refills | Status: AC
Start: 2014-08-21 — End: 2015-08-21

## 2014-08-21 NOTE — Progress Notes (Signed)
Paul Mccarthy, a 39 year old male,  presents to [facility] for a comprehensive periodic review.  Presently, the patient has the following medical concerns:  Did PT for the right shoulder pain,   Medications: reviewed and updated (see med list).  Allergies: reviewed and updated (see allergy list).  Soical history: reviewed  Family history: reviewed  ROS  Pain: no  Depressed or anxious?: no  Skin problems?: no  vision?: has rx, has not filled.  Dentist?: needs  Hearing problems: no  Headaches: no  Chest pain: no  Dyspnea: no  Abdominal pain: no  Constipation: no  Diarrhea: no  Blood in stool: no  Urinary complaints: no  Weight change:  no  EXAM  BP 110/78  Pulse 71  Temp 97.9 F (36.6 C) (Temporal)  Resp 16  Wt 103 kg (227 lb)  BMI 26.92 kg/m2   Body mass index is 26.92 kg/(m^2).   General: alert, well appearing, no distress  Heent: boggy turbs, clear d/c. mildy injected eyes, otherwise wnl.  Neck:Supple, no mass, no lad   Cvs:Rrr, no murmur   Pulm:Good effort, ctab   Abdom:+bs, soft, nt, nd, no masses   Extrem: no edema, no conerning lesions  Neuro: grossly intact, and gait is normal, speech is normal  Shoulders: pain w/ empty bottle test, and mild tender superolateral aspect of shoulder, otherwise all nromal exam.  [name] seen today for the following issues:    (Z00.00) Routine general medical examination at a health care facility  (primary encounter diagnosis)  Comment: discussed options  Plan: he is afraid he will faint w/ needles. discussed options        He would like gc/chlamydai, if postive, will proceed to blood testing for stds.  Up to date other labs.  Low risk lifestyle, good bp  Info on local dentists provided    (M25.511) Right shoulder pain  Comment: chronic, he would like an mri. discussed i am not sure this will help. i suspect strongly this is chronic rotatoc cuff issue, though, labral issue in ddx as well. 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 The patient/guardian expressed understanding and no barriers to adherence were identified.    The pt understands and agrees with the plan.

## 2014-08-21 NOTE — Progress Notes (Signed)
.  Patient came here after I called him to do his PHQ-9. He had a visit with Dr. Billey GoslingGerweck today and came to see me.    Patient says is feeling better. He is not taking any anti-depressant medication. He took in the past but felt sleepy, had several side effects and decided to stop.  Patient is single. Lives with his sister-in-law and 929 yo niece. He rents a room in their house. His sister-in-law is separated from his brother. Brother went back to [place].  He likes to live there because is convenient, is affordable. At the same time he does not have a lot of freedom to bring some friends, or do anything as his house.    Patient has an [place] girlfriend. They have been together for one year and a half. The girl is 41 yo. She has invited him to live together but he is not very sure. He has plans to go back to [place] and his girlfriend does not want to live in [place]. She has two cats (very attached to them). He does not like cats. He has talked to her about it but she likes the cats and is not willing to compromise about it.    Discussed with patient about Advantages/Disavantages of No Change: "His current life". He is debating himself if move back to [place] or not.  Advantages: Where he lives allows him to save Money/It is convenient/Close to work  Disadvantages: No leisure time/No friends/Girlfriend lives far from his work in case he move to her house    Change:  Disadvantages: No jobs in [place]/Safety/Lack of health insurance  Advantages: Better life in terms of leisure time/Close to family/Be in his country with his culture/No stress to be undocumented    Also discussed about physical activities to help with his depression. 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 Planning to do more walking. He will also planning to do more activities that involves his girlfriend like walking, running on the beach.  Patient used to go to the church. He has missing to work on his spiritual life.    Provided patient with  Problem Solving worksheet to help him to brainstorm some of issues discussed.    He will come back to follow up on 09/13/14.    Bobbe MedicoSandra Krissa Utke - Care Partner.

## 2014-08-24 LAB — CHLAMYDIA GC NAAT
GENPROBE CHLAMYDIA: NEGATIVE
GENPROBE GC: NEGATIVE

## 2014-08-25 ENCOUNTER — Encounter (HOSPITAL_BASED_OUTPATIENT_CLINIC_OR_DEPARTMENT_OTHER): Payer: Self-pay | Admitting: Family Medicine

## 2014-09-07 ENCOUNTER — Ambulatory Visit (HOSPITAL_BASED_OUTPATIENT_CLINIC_OR_DEPARTMENT_OTHER): Payer: PRIVATE HEALTH INSURANCE | Admitting: Family Medicine

## 2014-09-07 ENCOUNTER — Encounter (HOSPITAL_BASED_OUTPATIENT_CLINIC_OR_DEPARTMENT_OTHER): Payer: Self-pay | Admitting: Family Medicine

## 2014-09-07 VITALS — BP 134/76 | HR 65 | Temp 98.6°F | Wt 227.0 lb

## 2014-09-07 DIAGNOSIS — M67911 Unspecified disorder of synovium and tendon, right shoulder: Secondary | ICD-10-CM

## 2014-09-07 DIAGNOSIS — M67919 Unspecified disorder of synovium and tendon, unspecified shoulder: Secondary | ICD-10-CM | POA: Insufficient documentation

## 2014-09-07 DIAGNOSIS — G2589 Other specified extrapyramidal and movement disorders: Principal | ICD-10-CM | POA: Insufficient documentation

## 2014-09-07 NOTE — Progress Notes (Signed)
[facility] FAMILY MEDICINE  Sports Med Consult     Chief Complaint: sports med consult for shoulder pain  Requested by: Paul Mccarthy     Subjective:   Paul Mccarthy is a 39 year old male patient who has a history of:  Patient Active Problem List:     Anal fissure     Allergic rhinitis due to pollen     Major depression, recurrent     Social anxiety disorder     Hyperopia     Right shoulder pain    Right shoulder pain  - painter and Holiday representative work for 10 years, able to do job, but bothers him at times  - started last year  - overhead motion hurts  - went to PT - few weeks no help after month  - went to PT one day per week, did the exercises at home then 3-4 times per week at home - stopped when PT ended  - naproxen - helped a little  - no history of injury, fracture  - last year went to the gym 2-3 times per week doing more cardio now    ROS: no skin changes, no numbness/tingling, no weakness, positive joint pain    Objective:   BP 134/76  Pulse 65  Temp 98.6 F (37 C) (Temporal)  Wt 103 kg (227 lb)  SpO2 98%  BMI 26.92 kg/m2  General: alert, appears stated age and cooperative  Pulmonary: breathing without distress  Skin: skin color, texture, turgor are normal, No rashes or lesions  Psych: interactive and Normal Mood   Neuro: Alert and oriented X 3    Musculoskeletal Exam  right shoulder compared to normal left shoulder  Inspection: shoulders were properly exposed  Negative asymmetry  Negative atrophy  Negative skin changes  Positive scapular dyskinesis  Palpation:    nontender Acromioclavicular joint nontender Greater tuberosity nontender biceps tendon  ROM:   180 degrees forward flexion  180 degrees abduction  Negative drop arm test  Negative painful arc  70 degrees external rotation  internal rotation to thoracic  Strength: 5/5 Empty can test (pain), Resisted external rotation, Lift off test  Impingement:   Negative Hawkins   Negative Neer  Stability:   Negative Apprehension and relocation test  Special  testing:   Negative O'brien's test  Negative AC compression test  Negative Speeds test    Assessment & Plan:      (G25.89) Scapular dyskinesis  (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 Likely placed more pressure on rotator cuff in terms of mechanics. Patient is amenable to therapy after we talked a lot about how this takes time to improve. No reason to think tear, so do not need to get MRI.   Plan:     1. Follow up in 4 weeks if not improving with PT  2. Referral to PT already placed, return to finish course  3. If not improving try subacromial injection and XRay  4. Imaging defer for now  5. Counseled about imaging, diagnosis, and long term prognosis  6. Patient voiced understanding of plan and agreed with course of action    I am having Paul Mccarthy maintain his sildenafil, naproxen, carboxymethylcellulose, fluticasone, and traZODone.      Paul Lunch DO    I have spent 25 minutes in face to face time with this patient/patient proxy of which > 50% was in counseling or coordination of care  regarding above issues/Dx.      Chart cc'd to PCP and above provider that requested the consultation

## 2014-09-13 ENCOUNTER — Ambulatory Visit (HOSPITAL_BASED_OUTPATIENT_CLINIC_OR_DEPARTMENT_OTHER): Payer: Self-pay

## 2014-10-23 ENCOUNTER — Ambulatory Visit (HOSPITAL_BASED_OUTPATIENT_CLINIC_OR_DEPARTMENT_OTHER): Payer: PRIVATE HEALTH INSURANCE | Admitting: Rehabilitative and Restorative Service Providers"

## 2014-10-23 DIAGNOSIS — M25511 Pain in right shoulder: Principal | ICD-10-CM

## 2014-10-23 DIAGNOSIS — G8929 Other chronic pain: Principal | ICD-10-CM

## 2014-10-23 NOTE — Progress Notes (Signed)
Marland KitchenOUTPATIENT EVALUATION    Referring Provider: Cecille Amsterdam, MD    Precautions: None at this time.    SUBJECTIVE  Hx of Present Illness: Pt is a 39 year old male who presents to physical therapy with a physician diagnosis of R shoulder pain.  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  Pt cites pain in lateral deltoid.  Pt doesn't have time to rest.  Pt reports pain onset occurs after 30 minutes of use, not painful initially.    Onset Date: 03/2014  Onset Code: 05  ICD-10 Code: M25.511, G89.29    Imaging: None on file.      SUBJECTIVE    Prior Level of Function: I    Pain Level: 6/10    Dominant Extremity: R    IADL'S/Work: IMPAIRED: Pt has pain when donning a shirt, working at work as a Psychologist, educational, pt reports problems with sleeping on his R side.    Dressing/Grooming: IMPAIRED: Donning/doffing a shirt    Driving/sitting tolerance: WNL    Sleeping: IMPAIRED: Pain when sleeping on his R side.    Aggravating Factors: Working, lifting arm past 90 degrees.    Alleviating Factors: Rest, Pendulum exercise.    Past Medical History: See Medical Record.    Medications: (Rx Comments, concerns): For a list of current medications review the Medication activity.   Mental Status/Communication: WNL    Learns Best: Practice, written, demonstration.      Objective:     10/23/14 1500   Evaluation Type   Evaluation Type Initial Evaluation   Rehab Discipline   Rehab Discipline PT   Visit   Visit number 0   POC Due date 8   Pain   Pain Score 6    Patient Stated Goals   Patient stated goals to be in less R shoulder pain   Posture   Rounded Shoulders Moderate   Protracted Scapulae  Minimal   Posture assessment X   Spine Assessment   Spine Assessment Cervical   Cervical Assessment   Overall Cervical Non-contributory   LUE AROM (degrees)   L Shoulder Flexion  0-170 180 Degrees   L Shoulder ABduction 0-40 135  Degrees   L Shoulder Internal Rotation  0-70 70 Degrees   L Shoulder External Rotation  0-90 90 Degrees   L Elbow Flexion/Extension 0-135-150 150 Degrees   LUE Strength   L Shoulder Flexion 5/5   L Shoulder ABduction 5/5   L Shoulder Internal Rotation 5/5   L Shoulder External Rotation 5/5   L Elbow Flexion 5/5   L Elbow Extension 5/5   RUE AROM (degrees)   R Shoulder Flexion  0-170 180 Degrees   R Shoulder ABduction 0-140 135 Degrees   R Shoulder Internal Rotation  0-70 70 Degrees   R Shoulder External Rotation  0-90 70 Degrees   R Elbow Flexion/Extension 0-135-150 150 Degrees   RUE Strength   R Shoulder Flexion 4+/5   R Shoulder ABduction 4/5   R Shoulder Internal Rotation 4/5   R Shoulder External Rotation 4/5   R Elbow Flexion 5/5   R Elbow Extension 3+/5   Right Shoulder results   R Hawkins-Kennedy Negative   R Drop arm Negative   R Empty Can Positive   R Neer Negative   R Speed's Positive   R Yergason's Negative   R Shoulder O'Brien's Negative   Sensation   Light  Touch No apparent deficits   Coordination   Gross Motor Performance Observation WFL   Palpation   Tenderness to Palpation none noted   Increased Tissue Density none noted   Spine Joint Mobility (0-6 scale)   Spine Mobility assessment  Yes   Cervical   Passive Accessory Intervertebral Movement (PAIVM) 3/6   Patient Education   What was taught? role of PT, plan of care, HEP (W's against wall, scapular retraction with TB, pendulum, Ext Rot with TB, Prone Middle Trap)    Method Verbal;Demo;Practice   Patient comprehension Yes     FUNCTIONAL DEFICITS: Pain when donning/doffing shirt, working in painting/plastering/construction, pain when sleeping on his R side.  FUNCTIONAL OUTCOME MEASURES:       Physical Therapy Plan of Care    ZH:YQMVHQI Gerweck,MD  Referring Provider: Vinnie Langton  Diagnosis: R Shoulder Pain    Assessment/Objective Findings: Patient is a 39 year old male with complaints of pain in his R shoulder.  The following PT problems were  noted upon evaluation: pain in R shoulder and lateral deltoid referral pattern for RTC, decreased R shoulder external rotation and abduction ROM, decreased R shoulder strength in flexion/abduction, decreased elbow extension strength, positive Empty Can and Speed's Test.  Given lack of incident and lack of precipitous event, signs, symptoms and testing indicate RTC pathology. These problems limit the patient with the following functional activities: Pain when donning/doffing shirt, working in painting/plastering/construction, pain when sleeping on his R side.. This patient will benefit from skilled PT plan of care. The prescribed treatment plan of care is medically necessary secondary to R shoulder pain.  Co-morbidities of Problem List     Anal fissure   Allergic rhinitis due to pollen   Major depression, recurrent   Social anxiety disorder   Hyperopia   Right shoulder pain   Tendinopathy of rotator cuff   Scapular dyskinesis      were identified and taken into considerations of plan of care.  In my professional opinion this patient requires skilled physical therapy to address the concerns of the functional limitations outlined above.    Short Term Functional Goals: 4 weeks.   Pt will demonstrate independence and compliance with HEP in 2 weeks  Pt will demonstrate improved ROM of R shoulder abduction by 45 degrees in 4 weeks  Pt will demonstrate improved ROM of R shoulder external by 20 degrees in 4 weeks   Pt will demonstrate improved postural awareness by sitting with biomechanically correct posture > 30 minutes to improve overall postural function in 4 weeks    Pt will demonstrate improved strength of R shoulder flexion by 1 MMT grade in 4 weeks   Pt will demonstrate improved strength of R shoulder abduction by 1 MMT grade in 4 weeks   Pt will demonstrate improved strength of R elbow extension by 1 MMT grade in 4 weeks    Pt will demonstrate improved tolerance of sleeping on R side by able to sleep throughout  night on R side in 4 weeks        Long Term Goal: 8 weeks.   Pt will demonstrate ability to donn/doff a shirt without R shoulder pain in 8 weeks.  Pt will demonstrate ability to sleep on his R side without R shoulder pain in 8 weeks.    Treatment Plan: ** PT Eval (CPT 97001)  ** Stretching/ROM Exercise [phone])  ** Therapeutic Exercise (CPT [phone])  ** Home Exercise Program (CPT [phone])  ** Neuromuscular Re-education (CPT [phone])  **  Joint Mobilization (CPT 97140)  ** Soft Tissue Mobilization (CPT 97140)  ** Electrical Stimulation/TENS (CPT 97014)  ** Hot/Cold Rx (CPT 97010)  ** Manual Traction (CPT 97140)  ** Functional Activities (CPT 97530)  ** Patient Education (CPT [phone])    Recommend Physical Therapy be continued 1 times per week for 8 weeks.  The rehabilitation potential for this patient is excellent    Patient Meryle Ready is aware of attendance policy: Yes  Plan of care discussed with Patient/Family: Yes  Patient goals reviewed and incorporated in plan of care: Yes  Patient/Family agrees with plan of care: Yes  Patient/Family education: Yes  Does patient feel safe at home:  Yes      Myer Haff, PT, DPT, HFS

## 2014-10-23 NOTE — Progress Notes (Signed)
I certify that the documented Treatment Plan is reasonable and necessary.    10/23/2014  Vinnie Langton

## 2014-10-30 ENCOUNTER — Ambulatory Visit (HOSPITAL_BASED_OUTPATIENT_CLINIC_OR_DEPARTMENT_OTHER): Payer: Self-pay | Admitting: Rehabilitative and Restorative Service Providers"

## 2014-11-06 ENCOUNTER — Ambulatory Visit (HOSPITAL_BASED_OUTPATIENT_CLINIC_OR_DEPARTMENT_OTHER): Payer: PRIVATE HEALTH INSURANCE | Admitting: Rehabilitative and Restorative Service Providers"

## 2014-11-06 DIAGNOSIS — G8929 Other chronic pain: Principal | ICD-10-CM

## 2014-11-06 DIAGNOSIS — M25511 Pain in right shoulder: Principal | ICD-10-CM

## 2014-11-06 NOTE — Progress Notes (Signed)
.  S: Pt reports feeling ok today, 4/10 pain, only hurts when he raises it above his head.  O: Refer to Rehabilitation Treatment Flowsheet     11/06/14 1700   Rehab Discipline   Rehab Discipline PT   Visit   Visit number 1   POC Due date 8   Time Calculation   Start Time 1800   Stop Time 1830   Time Calculation (min) 30 min   Pain   Pain Score 6    Manual Therapy   Technique Post/Inf R GH Jt mobs   Time in minutes 6   Manual Therapy 2   Technique PNF Multidirectional Perturbations   Time in minutes 3   Manual Therapy 3   Technique Plan- IR/ER Stretch   Manual Therapy 4   Technique Plan- IASTM R UT   Ther Exercise   Exercise Supine Lower Trap Ball Squeeze   Reps 20   Sets 2   Ther Exercise 2   Exercise T-Spine Ext in Chair   Holds 2 plan   Ther Exercise 3   Exercise 3 Quadruped Reaches   Reps 3 10   Sets 3 3   Holds 3 Yellow TB   Ther Exercise 4   Exercise 4 Serratus Push Ups   Reps 4 10   Sets 4 3   Ther Exercise 5   Exercise 5 Scapular Retraction   Reps 5 10   Sets 5 3   Holds 5 Black TB   Ther Exercise 6   Exercise 6 W's on Wall   Reps 6 20   Sets  1   Ther Exercise 7   Exercise 7 Straight Arm Pull Down   Patient Education   What was taught? role of PT, plan of care, HEP (W's against wall, scapular retraction with TB, pendulum, Ext Rot with TB, Prone Middle Trap)    Method Verbal;Demo;Practice   Patient comprehension Yes     A: Pt tolerated manual therapy with good effect this visit, no complaints of pain.  Pt performed therex with good tolerance to activity this visit, no pain, some difficulty with Wall W's as evident lumbar substitution had to be corrected.  Additionally, pt displays severe scapular winging on prone serratus push ups.  Pt is in need of continued skilled PT.  P: plan to progress as pt tolerates.

## 2014-11-13 ENCOUNTER — Ambulatory Visit (HOSPITAL_BASED_OUTPATIENT_CLINIC_OR_DEPARTMENT_OTHER): Payer: PRIVATE HEALTH INSURANCE | Admitting: Rehabilitative and Restorative Service Providers"

## 2014-11-13 DIAGNOSIS — M25511 Pain in right shoulder: Principal | ICD-10-CM

## 2014-11-13 DIAGNOSIS — G8929 Other chronic pain: Principal | ICD-10-CM

## 2014-11-13 NOTE — Progress Notes (Signed)
.  S: Pt reports feeling good after last session.  O: Refer to Rehabilitation Treatment Flowsheet     11/13/14 1700   Rehab Discipline   Rehab Discipline PT   Visit   Visit number 2   POC Due date 8   Time Calculation   Start Time 1730   Stop Time 1800   Time Calculation (min) 30 min   Manual Therapy   Technique Post/Inf R GH Jt mobs   Time in minutes 4   Manual Therapy 2   Technique PNF Multidirectional Perturbations   Time in minutes 3   Manual Therapy 3   Technique Plan- IR/ER Stretch   Time in minutes 2   Ther Exercise   Exercise Supine Lower Trap Ball Squeeze   Reps 20   Sets 2   Ther Exercise 2   Exercise T-Spine Ext in Chair   Reps 2 5   Sets 2 3   Ther Exercise 3   Exercise 3 Quadruped Reaches   Reps 3 10   Sets 3 3   Holds 3 Yellow TB   Ther Exercise 4   Exercise 4 Serratus Push Ups   Reps 4 20   Sets 4 2   Ther Exercise 5   Exercise 5 Scapular Retraction   Reps 5 10   Sets 5 3   Holds 5 Black TB   Patient Education   What was taught? role of PT, plan of care, HEP (W's against wall, scapular retraction with TB, pendulum, Ext Rot with TB, Prone Middle Trap)    Method Verbal;Demo;Practice   Patient comprehension Yes     A: Pt tolerated manual therapy with good effect today, no pain reported.  Pt performed therex with increased tolerance to activity, is slowly progressing in his ability to handle increases in workload.  Pt is in need of continued skilled PT.  P: plan to progress as pt tolerates.

## 2014-11-20 ENCOUNTER — Ambulatory Visit (HOSPITAL_BASED_OUTPATIENT_CLINIC_OR_DEPARTMENT_OTHER): Payer: Self-pay | Admitting: Rehabilitative and Restorative Service Providers"

## 2014-11-30 ENCOUNTER — Ambulatory Visit (HOSPITAL_BASED_OUTPATIENT_CLINIC_OR_DEPARTMENT_OTHER): Payer: PRIVATE HEALTH INSURANCE | Admitting: Rehabilitative and Restorative Service Providers"

## 2014-11-30 DIAGNOSIS — G8929 Other chronic pain: Principal | ICD-10-CM

## 2014-11-30 DIAGNOSIS — M25511 Pain in right shoulder: Principal | ICD-10-CM

## 2014-11-30 NOTE — Progress Notes (Signed)
.  S: Pt reports feeling ok today, hard to keep his arm out of pain d/t the work that he does.  O: Refer to Rehabilitation Treatment Flowsheet     11/30/14 1700   Rehab Discipline   Rehab Discipline PT   Visit   Visit number 3   POC Due date 8   Time Calculation   Start Time 1700   Stop Time 1730   Time Calculation (min) 30 min   Pain   Pain Score 5    Manual Therapy   Technique Post/Inf R GH Jt mobs   Time in minutes 4   Manual Therapy 2   Technique PNF Multidirectional Perturbations   Time in minutes 3   Manual Therapy 3   Technique IR/ER Stretch   Time in minutes 2   Ther Exercise   Exercise Supine Lower Trap Ball Squeeze   Reps 20   Sets 2   Ther Exercise 3   Exercise 3 Quadruped Reaches   Reps 3 10   Sets 3 3   Holds 3 Yellow TB   Ther Exercise 4   Exercise 4 Serratus Push Ups   Reps 4 20   Sets 4 2   Ther Exercise 5   Exercise 5 Scapular Retraction   Reps 5 10   Sets 5 3   Holds 5 Black TB   Ther Exercise 6   Exercise 6 W's on Wall   Reps 6 20   Sets 6 1   Patient Education   What was taught? role of PT, plan of care, HEP (W's against wall, scapular retraction with TB, pendulum, Ext Rot with TB, Prone Middle Trap)    Method Verbal;Demo;Practice   Patient comprehension Yes     A: Pt tolerated manual therapy with good effect today, no pain reported during mobs or PNF.  Pt performed therex with increased tolerance to activity this visit, no pain reported, some fatigue noted during wall W's stretch.  Pt is progressing but d/t the nature of his work he is constantly subjecting his shoulder to overhead stress and strain.  Pt is in need of continued skilled PT.  P: plan to progress as pt tolerates.

## 2014-12-05 ENCOUNTER — Ambulatory Visit (HOSPITAL_BASED_OUTPATIENT_CLINIC_OR_DEPARTMENT_OTHER): Payer: PRIVATE HEALTH INSURANCE | Admitting: Rehabilitative and Restorative Service Providers"

## 2014-12-05 DIAGNOSIS — G8929 Other chronic pain: Principal | ICD-10-CM

## 2014-12-05 DIAGNOSIS — M25511 Pain in right shoulder: Principal | ICD-10-CM

## 2014-12-05 NOTE — Progress Notes (Signed)
.  S: Pt reports going to the gym 1x/wk, going ok.  Not too much pain today.  O: Refer to Rehabilitation Treatment Flowsheet     12/05/14 1600   Rehab Discipline   Rehab Discipline PT   Visit   Visit number 4   POC Due date 8   Time Calculation   Start Time 1700   Stop Time 1730   Time Calculation (min) 30 min   Pain   Pain Score 5    Manual Therapy   Technique Post/Inf R GH Jt mobs   Time in minutes 6   Manual Therapy 2   Technique PNF Multidirectional Perturbations   Time in minutes 3   Ther Exercise   Exercise Supine Lower Trap Ball Squeeze   Reps 20   Sets 2   Ther Exercise 2   Exercise Pulleys   Reps 2 20   Sets 2 2   Ther Exercise 3   Exercise 3 Quadruped Reaches   Reps 3 10   Sets 3 3   Holds 3 Yellow TB   Ther Exercise 4   Exercise 4 Serratus Push Ups   Reps 4 20   Sets 4 2   Ther Exercise 5   Exercise 5 Scapular Retraction   Reps 5 20   Sets 5 2   Holds 5 Black TB   Ther Exercise 6   Exercise 6 W's on Wall   Reps 6 20   Sets 6 1   Ther Exercise 7   Exercise 7 Serratus Wall Slide c/ Towel   Reps 7 20   Sets 7 2   Holds 7 Yellow TB   Patient Education   What was taught? role of PT, plan of care, HEP (W's against wall, scapular retraction with TB, pendulum, Ext Rot with TB, Prone Middle Trap)    Method Verbal;Demo;Practice   Patient comprehension Yes     A: Pt tolerated manual therapy with good effect this visit, no pain reported.  Pt performed therex with good tolerance to activity, no pain reported.  Pt performed additional reps and sets today displaying endurance and strength increases.  Pt is in need of continued skilled PT.  P: plan to progress as pt tolerates.

## 2014-12-11 ENCOUNTER — Ambulatory Visit (HOSPITAL_BASED_OUTPATIENT_CLINIC_OR_DEPARTMENT_OTHER): Payer: PRIVATE HEALTH INSURANCE | Admitting: Rehabilitative and Restorative Service Providers"

## 2014-12-11 DIAGNOSIS — M25511 Pain in right shoulder: Principal | ICD-10-CM

## 2014-12-11 DIAGNOSIS — G8929 Other chronic pain: Principal | ICD-10-CM

## 2014-12-11 NOTE — Progress Notes (Signed)
.  S: Pt reports feeling good, had some pain because of reaching overhead during work.  O: Refer to Rehabilitation Treatment Flowsheet     12/11/14 1700   Rehab Discipline   Rehab Discipline PT   Visit   Visit number 5   POC Due date 8   Time Calculation   Start Time 1730   Stop Time 1800   Time Calculation (min) 30 min   Pain   Pain Score 5    Manual Therapy   Technique Post/Inf R GH Jt mobs   Time in minutes 6   Manual Therapy 2   Technique PNF Multidirectional Perturbations   Time in minutes 3   Ther Exercise   Exercise Supine Lower Trap Ball Squeeze   Reps 20   Sets 2   Ther Exercise 2   Exercise Pulleys   Reps 2 20   Sets 2 2   Ther Exercise 3   Exercise 3 Quadruped Reaches   Reps 3 10   Sets 3 3   Holds 3 Yellow TB   Ther Exercise 4   Exercise 4 Serratus Push Ups   Reps 4 20   Sets 4 2   Ther Exercise 5   Exercise 5 Scapular Retraction   Reps 5 20   Sets 5 2   Holds 5 Black TB   Ther Exercise 6   Exercise 6 W's on Wall   Reps 6 20   Sets 6 1   Ther Exercise 7   Exercise 7 Serratus Wall Slide c/ Towel   Reps 7 20   Sets 7 2   Holds 7 Yellow TB   Ther Exercise 8   Exercise 8 Reverse Deloaded Shoulder Flexion   Holds 8 2x10 blue TB   Patient Education   What was taught? role of PT, plan of care, HEP (W's against wall, scapular retraction with TB, pendulum, Ext Rot with TB, Prone Middle Trap)    Method Verbal;Demo;Practice   Patient comprehension Yes     A: Pt tolerated manual therapy today with good effect, no pain reported.  Pt performed therex with good tolerance to activity this visit, no pain.  Pt displays good scapulohumeral rhythm during abduction and flexion exercises.  Pt is in need of continued skilled PT.  P: plan to progress as pt tolerates.

## 2014-12-18 ENCOUNTER — Ambulatory Visit (HOSPITAL_BASED_OUTPATIENT_CLINIC_OR_DEPARTMENT_OTHER): Payer: PRIVATE HEALTH INSURANCE | Admitting: Rehabilitative and Restorative Service Providers"

## 2014-12-18 DIAGNOSIS — M25511 Pain in right shoulder: Principal | ICD-10-CM

## 2014-12-18 DIAGNOSIS — G8929 Other chronic pain: Principal | ICD-10-CM

## 2014-12-18 NOTE — Progress Notes (Signed)
.  S: Pt reports that the past 2-3 weeks his shoulder has been feeling better, 3/10 pain today.  O: Refer to Rehabilitation Treatment Flowsheet     12/18/14 1700   Rehab Discipline   Rehab Discipline PT   Visit   Visit number 6   POC Due date 8   Time Calculation   Start Time 1655   Stop Time 1725   Time Calculation (min) 30 min   Pain   Pain Score 3    Manual Therapy   Technique Post/Inf R GH Jt mobs   Time in minutes 6   Manual Therapy 2   Technique PNF Multidirectional Perturbations   Time in minutes 3   Manual Therapy 3   Technique Manual Pec Stretch   Time in minutes 2   Ther Exercise   Exercise Supine Lower Trap Ball Squeeze   Reps 20   Sets 2   Ther Exercise 3   Exercise 3 Quadruped Reaches   Reps 3 10   Sets 3 3   Holds 3 Red TB   Ther Exercise 4   Exercise 4 Serratus Push Ups   Reps 4 20   Sets 4 2   Ther Exercise 5   Exercise 5 Scapular Retraction   Reps 5 20   Sets 5 2   Holds 5 Black TB   Ther Exercise 6   Exercise 6 W's on Wall   Reps 6 20   Sets 6 1   Ther Exercise 7   Exercise 7 Serratus Wall Slide c/ Towel   Reps 7 20   Sets 7 2   Holds 7 Yellow TB   Ther Exercise 8   Exercise 8 Thoracic Ext on Foam Roller   Holds 8 10   Patient Education   What was taught? role of PT, plan of care, HEP (W's against wall, scapular retraction with TB, pendulum, Ext Rot with TB, Prone Middle Trap)    Method Verbal;Demo;Practice   Patient comprehension Yes     A: Pt tolerated manual therapy with good effect today, no pain reported.  New exercise of foam roller thoracic extension was well tolerated, cavitations felt by pt.  Pt performed remaining therex with good effect.  Pt is in need of continued skilled PT.  P: plan to progress as pt tolerates.

## 2014-12-25 ENCOUNTER — Ambulatory Visit (HOSPITAL_BASED_OUTPATIENT_CLINIC_OR_DEPARTMENT_OTHER): Payer: PRIVATE HEALTH INSURANCE | Admitting: Rehabilitative and Restorative Service Providers"

## 2014-12-25 DIAGNOSIS — M25511 Pain in right shoulder: Principal | ICD-10-CM

## 2014-12-25 DIAGNOSIS — G8929 Other chronic pain: Principal | ICD-10-CM

## 2014-12-25 NOTE — Progress Notes (Signed)
.  S: Pt reports feeling ok today, 4/10 pain, been going to the gym doing back and it feels good, however he is avoiding chest and shoulder presses d/t R shoulder pain the last time he did it months ago.  Pt reports an improvement the past few weeks.  O: Refer to Rehabilitation Treatment Flowsheet     12/25/14 1600   Rehab Discipline   Rehab Discipline PT   Visit   Visit number 7   POC Due date 8   Time Calculation   Start Time 1655   Stop Time 1725   Time Calculation (min) 30 min   Pain   Pain Score 4    Manual Therapy   Technique Post/Inf R GH Jt mobs   Time in minutes 6   Manual Therapy 2   Technique PNF Multidirectional Perturbations   Time in minutes 3   Ther Exercise   Exercise Supine Lower Trap Ball Squeeze   Reps 20   Sets 2   Ther Exercise 2   Exercise Face Pulls with TB 90 shoulder flexion 90 elbow flexion   Holds 2 Blue TB 2x15   Ther Exercise 3   Exercise 3 Quadruped Reaches   Holds 3 3x10 Red TB   Ther Exercise 4   Exercise 4 Serratus Push Ups   Holds 4 2x20   Ther Exercise 5   Exercise 5 Scapular Retraction   Holds 5 2x20 Black   Ther Exercise 6   Exercise 6 W's on Wall   Reps 6 20   Sets 6 1   Ther Exercise 7   Exercise 7 Serratus Wall Slide c/ Towel   Holds 7 2x20 Yellow TB   Ther Exercise 8   Exercise 8 Thoracic Ext on Foam Roller   Holds 8 10   Patient Education   What was taught? role of PT, plan of care, HEP (W's against wall, scapular retraction with TB, pendulum, Ext Rot with TB, Prone Middle Trap)    Method Verbal;Demo;Practice   Patient comprehension Yes     A: Pt tolerated manual therapy with good effect today, no pain reported.  Pt performed therex with good tolerance to activity this visit, is nearing discharge for home management with HEP.  New exercise of face pulls was well tolerated.  P: plan to discharge with continuation of HEP.

## 2015-02-13 ENCOUNTER — Telehealth (HOSPITAL_BASED_OUTPATIENT_CLINIC_OR_DEPARTMENT_OTHER): Payer: Self-pay | Admitting: Family Medicine

## 2015-02-13 NOTE — Progress Notes (Signed)
Call to pt.  Reports already had [facility].  "I wanted just to do lab testing that [facility] because it's easier for me to get to".  Will f/u with IVF clinic for specific labs they are requesting.  Will drop off info tomorrow.      Rocky LinkKenLorain Childes:  FYI

## 2015-02-13 NOTE — Progress Notes (Signed)
To [facility] nurse:    Can you call pt, let him know -    I rec'd a fax of an order for semen analysis for infertility work up.  The fax is an actual order form, ie, it appears they want him to do the semen analysis through them [facility] Is that his understanding?    Or does he want us to order and collect the semen analysis and then send him the results by mail?    Rocky LinkKen

## 2015-02-26 ENCOUNTER — Encounter (HOSPITAL_BASED_OUTPATIENT_CLINIC_OR_DEPARTMENT_OTHER): Payer: Self-pay | Admitting: Family Medicine

## 2015-02-26 ENCOUNTER — Telehealth (HOSPITAL_BASED_OUTPATIENT_CLINIC_OR_DEPARTMENT_OTHER): Payer: Self-pay | Admitting: Family Medicine

## 2015-02-26 ENCOUNTER — Ambulatory Visit (HOSPITAL_BASED_OUTPATIENT_CLINIC_OR_DEPARTMENT_OTHER): Payer: PRIVATE HEALTH INSURANCE | Admitting: Family Medicine

## 2015-02-26 VITALS — BP 112/80 | Temp 97.8°F

## 2015-02-26 DIAGNOSIS — N529 Male erectile dysfunction, unspecified: Principal | ICD-10-CM

## 2015-02-26 DIAGNOSIS — Z319 Encounter for procreative management, unspecified: Secondary | ICD-10-CM

## 2015-02-26 DIAGNOSIS — Z23 Encounter for immunization: Secondary | ICD-10-CM

## 2015-02-26 LAB — CBC WITH PLATELET
HEMATOCRIT: 47.7 % (ref 40.1–51.0)
HEMOGLOBIN: 15.8 g/dL (ref 13.7–17.5)
MEAN CORP HGB CONC: 33.1 g/dL (ref 31.0–37.0)
MEAN CORPUSCULAR HGB: 30.6 pg (ref 26.0–34.0)
MEAN CORPUSCULAR VOL: 92.3 fL (ref 80.0–100.0)
MEAN PLATELET VOLUME: 11.3 fL (ref 8.7–12.5)
PLATELET COUNT: 220 10*3/uL (ref 150–400)
RBC DISTRIBUTION WIDTH STD DEV: 45.5 fL (ref 35.1–46.3)
RBC DISTRIBUTION WIDTH: 13.5 % (ref 11.5–14.3)
RED BLOOD CELL COUNT: 5.17 M/uL (ref 4.60–6.10)
WHITE BLOOD CELL COUNT: 7.2 10*3/uL (ref 4.0–11.0)

## 2015-02-26 MED ORDER — TRAZODONE HCL 50 MG PO TABS
ORAL_TABLET | ORAL | 6 refills | Status: DC
Start: 2015-02-26 — End: 2017-06-10

## 2015-02-26 NOTE — Progress Notes (Signed)
SUBJECTIVE:  39 year old male  presents for gfirend and pt trying to have a baby. She is 40, he is 39. She is going for treatment, starting already. And he needs 'blood work' according to them, but not covered there.   Had brought semen anaylsis but did not need sem analysis. But needs blood tests. He give info to a nurse  Smoking status: Never Smoker                                                              Alcohol use: Yes           2.5 oz/week     5 Standard drinks or equivalent per week     Comment: on weekends       Drug use: No      Social History Narrative    From [place]. In [place].         Is singe, not married, lives w/ brother and sister-in-law.    08/21/2014: now dating, this helps with the mood.    Works IT trainerpainting and carpenter.     And sells eucalyptus trees. 2015: the prices crashed ,very difficult for patient.        Abuse: no. But had a very tough time in [place] for about 5 years in the 90s - hard work, very busy, and mother died.    Seatb: yes    Ativities: goes to gym    Hobbies:not much , "i don't have much entertainment".         Family History    Diabetes      Comment: none    Heart Mother     Comment: MI age 41    Hypertension Brother     Cancer - Other      Comment: none         OBJECTIVE:  general: alert, well appearing, no distress   BP 112/80  Temp 97.8 F (36.6 C) (Temporal)   39 year old Paul Mccarthy seen today for the following issues:    (N52.9) Erectile dysfunction, unspecified erectile dysfunction type  (primary encounter and feritly concenrs.    Needs from fertility clinic some labs. 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 The patient/guardian expressed understanding and no barriers to adherence were identified.    The pt understands and agrees with the plan.             hInfluenza Vaccine Procedure  February 26, 2015

## 2015-02-26 NOTE — Progress Notes (Unsigned)
Maria R. from [facility] called the Central Refill Department to complete a benefit analysis for the TDAP Vaccine.    The vaccine is covered under the patient’s prescription coverage.    Please choose 90715.2 TDAP (Prior Auth/Pharmacy) and notify Central Refill via cc’d chart once the vaccine has been administered.

## 2015-02-26 NOTE — Progress Notes (Signed)
Influenza Vaccine Procedure  February 26, 2015    1. Has the patient received the information for the influenza vaccine? Yes    2. Does the patient have any of the following contraindications?  Allergy to eggs? No  Allergic reaction to previous influenza vaccines? No  Any other problems to previous influenza vaccines? No  Paralyzed by Guillain-Barre syndrome?  No  Current moderate or severe illness? No  Allergy to contact lens solution? No    3. The vaccine has been administered in the usual fashion.     Immunization information reviewed. Current VIS reviewed and given to patient/ guardian. Verbal assent obtained from patient/ guardian.  See immunization/Injection module or chart review for date of publication and additional information. Verbal assent obtained from patient/guardian. Comfort measures for possible side effects reviewed.

## 2015-02-27 LAB — CHLAMYDIA GC NAAT
GENPROBE CHLAMYDIA: NEGATIVE
GENPROBE GC: NEGATIVE

## 2015-03-01 ENCOUNTER — Encounter (HOSPITAL_BASED_OUTPATIENT_CLINIC_OR_DEPARTMENT_OTHER): Payer: Self-pay | Admitting: Family Medicine

## 2015-03-01 DIAGNOSIS — R17 Unspecified jaundice: Principal | ICD-10-CM

## 2015-03-01 LAB — COMPREHENSIVE METABOLIC PANEL
ALANINE AMINOTRANSFERASE: 52 U/L — ABNORMAL HIGH (ref 12–45)
ALBUMIN: 4.1 g/dL (ref 3.4–5.0)
ALKALINE PHOSPHATASE: 136 U/L — ABNORMAL HIGH (ref 45–117)
ANION GAP: 6 mmol/L (ref 5–15)
ASPARTATE AMINOTRANSFERASE: 29 U/L (ref 8–34)
BILIRUBIN TOTAL: 1.2 mg/dL — ABNORMAL HIGH (ref 0.2–1.0)
BUN (UREA NITROGEN): 18 mg/dL (ref 7–18)
CALCIUM: 9 mg/dL (ref 8.5–10.1)
CARBON DIOXIDE: 30 mmol/L (ref 21–32)
CHLORIDE: 105 mmol/L (ref 98–107)
CREATININE: 1.2 mg/dL (ref 0.7–1.2)
ESTIMATED GLOMERULAR FILT RATE: >60 mL/min (ref >60)
Glucose Random: 104 mg/dL (ref 74–160)
POTASSIUM: 4.2 mmol/L (ref 3.5–5.1)
SODIUM: 141 mmol/L (ref 136–145)
TOTAL PROTEIN: 7.8 g/dL (ref 6.4–8.2)

## 2015-03-01 LAB — HEPATITIS B SURFACE ANTIBODY: HEPATITIS B SURFACE ANTIBODY: NONREACTIVE

## 2015-03-01 LAB — TREPONEMA PALLIDUM AB IGG: TREPONEMA PALLIDUM AB IgG: NONREACTIVE

## 2015-03-01 LAB — HEPATITIS C ANTIBODY: HEPATITIS C ANTIBODY: NONREACTIVE

## 2015-03-01 LAB — HIV ANTIGEN ANTIBODY
HIV 1/2 PLUS O AG/AB SCREEN: NONREACTIVE
HIV RESULT INTERPRETATION: NONREACTIVE

## 2015-03-01 LAB — THYROID SCREEN TSH REFLEX FT4: THYROID SCREEN TSH REFLEX FT4: 1.78 u[IU]/mL (ref 0.358–3.740)

## 2015-03-01 LAB — HEPATITIS B SURFACE ANTIGEN: HEPATITIS B SURFACE ANTIGEN: NONREACTIVE

## 2015-05-23 ENCOUNTER — Telehealth (HOSPITAL_BASED_OUTPATIENT_CLINIC_OR_DEPARTMENT_OTHER): Payer: Self-pay | Admitting: Family Medicine

## 2015-05-23 DIAGNOSIS — R17 Unspecified jaundice: Principal | ICD-10-CM

## 2015-05-23 NOTE — Progress Notes (Signed)
To [place],    Can you call pt (port speaking) and let him know he is due for blood labs for check his liver.    Any weekday w/in next 30 days.    Does NOT need to be fasting    Thank you,  Rocky Link.

## 2015-05-24 ENCOUNTER — Telehealth (HOSPITAL_BASED_OUTPATIENT_CLINIC_OR_DEPARTMENT_OTHER): Payer: Self-pay

## 2015-05-24 NOTE — Progress Notes (Signed)
.  Left a message requesting a return phone call. [name], 05/24/2015, 1:14 PM

## 2015-12-18 ENCOUNTER — Telehealth (HOSPITAL_BASED_OUTPATIENT_CLINIC_OR_DEPARTMENT_OTHER): Payer: Self-pay | Admitting: Medical

## 2015-12-18 NOTE — Progress Notes (Signed)
Maria from [facility] called the Central Refill Department to complete a benefit analysis for the TDAP Vaccine.    Epic insurance is no longer active. Tried searching for active insurance for the patient, but no results came up.     Please choose 90715.2 TDAP (Prior Auth/Pharmacy)

## 2015-12-19 ENCOUNTER — Ambulatory Visit (HOSPITAL_BASED_OUTPATIENT_CLINIC_OR_DEPARTMENT_OTHER): Payer: Self-pay | Admitting: Medical

## 2016-04-17 ENCOUNTER — Ambulatory Visit (HOSPITAL_BASED_OUTPATIENT_CLINIC_OR_DEPARTMENT_OTHER): Payer: Self-pay | Admitting: Medical

## 2016-05-06 ENCOUNTER — Ambulatory Visit (HOSPITAL_BASED_OUTPATIENT_CLINIC_OR_DEPARTMENT_OTHER): Payer: Self-pay | Admitting: Medical

## 2016-06-05 ENCOUNTER — Ambulatory Visit (HOSPITAL_BASED_OUTPATIENT_CLINIC_OR_DEPARTMENT_OTHER): Payer: Self-pay | Admitting: Medical

## 2016-08-19 ENCOUNTER — Ambulatory Visit (HOSPITAL_BASED_OUTPATIENT_CLINIC_OR_DEPARTMENT_OTHER): Payer: Medicaid Other | Admitting: Medical

## 2016-08-19 VITALS — BP 123/64 | HR 67 | Wt 234.0 lb

## 2016-08-19 DIAGNOSIS — F3341 Major depressive disorder, recurrent, in partial remission: Principal | ICD-10-CM

## 2016-08-19 DIAGNOSIS — F5104 Psychophysiologic insomnia: Secondary | ICD-10-CM

## 2016-08-19 DIAGNOSIS — R5383 Other fatigue: Secondary | ICD-10-CM

## 2016-08-19 DIAGNOSIS — M545 Low back pain: Secondary | ICD-10-CM

## 2016-08-19 DIAGNOSIS — R6882 Decreased libido: Secondary | ICD-10-CM

## 2016-08-19 DIAGNOSIS — R5381 Other malaise: Secondary | ICD-10-CM

## 2016-08-19 DIAGNOSIS — H547 Unspecified visual loss: Secondary | ICD-10-CM

## 2016-08-19 MED ORDER — MELATONIN 3 MG PO TABS: 3 mg | tablet | Freq: Every evening | ORAL | 5 refills | 0 days | Status: AC

## 2016-08-19 MED ORDER — CYCLOBENZAPRINE HCL 10 MG PO TABS
10.00 mg | ORAL_TABLET | Freq: Every evening | ORAL | 0 refills | Status: AC | PRN
Start: 2016-08-19 — End: 2016-09-03

## 2016-08-19 MED ORDER — MELATONIN 3 MG PO TABS
3.00 mg | ORAL_TABLET | Freq: Every evening | ORAL | 5 refills | Status: AC
Start: 2016-08-19 — End: 2017-02-15

## 2016-08-19 MED ORDER — CYCLOBENZAPRINE HCL 10 MG PO TABS: 10 mg | tablet | Freq: Every evening | ORAL | 0 refills | 0 days | Status: AC | PRN

## 2016-08-19 MED FILL — CYCLOBENZAPR 10MG: 15 days supply | Qty: 15 | Fill #0 | Status: CP

## 2016-08-19 MED FILL — MELATONIN 3MG: 30 days supply | Qty: 30 | Fill #0 | Status: CP

## 2016-08-19 NOTE — Progress Notes (Signed)
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   Doesn't recall a precipitating injury.   Seems to be improving , bothers him still though.   Bothers him to be hunched over working.   Sitting sometimes bothers him- depends on the chair.   Walking is ok, doesn't bother him.   Has tried ibuprofen with relief.   No pain now but on bad days can be 6/10.   Usually the pain comes in the afternoon after work.   No hx of back pain.     No radiation of pain into his legs. No change in bladder or bowel function.   Shoulders still bother him but overall feels better than last year.     He also wants to see the eye doctor. Trouble seeing near for the last year.     Concern also about sex drive.   Low sex drive, worried about testosterone.     Also worried about energy levels.   Feeling very tired all the time.   Sleeps 5-6 hours a night.     Used to take trazodone , it did help in the past but he felt groggy in the morning so he stopped taking it.    Still feeling anxious a little bit. Denies depression currently, tried medication in the past ( lexapro) and therapy. Doesn't want to do again. Denies HI or SI.         REVIEW OF SYSTEMS:  Review of systems is negative for eyes, ENT, CV, resp, GI,skin, neuro and endo  + back pain   + vision changes   + malaise   + fatigue  + dec libido     OBJECTIVE:  BP 123/64 (Site: LA, Position: Sitting, Cuff Size: Reg)  Pulse 67  Wt 106.1 kg (234 lb)  SpO2 98%  BMI 27.75 kg/m2   Patient appears to be in mild to moderate pain, antalgic gait noted.  CV: RRR , no murmur  Lungs: CTA , no wheezes, rales or rhonchi   MSK-  Lumbosacral spine area reveals no local tenderness or mass.  Painful and reduced LS ROM noted. Straight leg raise is negative at 90 degrees on both sides. DTR's, motor strength and sensation normal, including heel and toe gait.  Peripheral pulses are palpable..    ASSESSMENT:   1. Recurrent major depressive  disorder, in partial remission (HCC)  Improved.   phq 2= 2 today. No HI or SI. Defers therapy.       2. Psychophysiological insomnia  Reviewed sleep hygiene including going to bed and waking up at the same time everyday.   Try not to eat, read, work, watch tv in bed.   Esp important to limit screen time before bed.    - melatonin 3 MG TABS tablet; Take 1 tablet by mouth nightly  Dispense: 30 tablet; Refill: 5    3. Vision problem    - REFERRAL TO OPHTHALMOLOGY ( INT)    4. Acute bilateral low back pain without sciatica  For acute pain, rest, intermittent application of heat (do not sleep on heating pad), analgesics and muscle relaxants are recommended. Discussed longer term treatment plan of prn NSAID's and discussed a home back care exercise program with flexion exercise routine. Proper lifting with avoidance of heavy lifting discussed. Consider Physical Therapy and XRay studies if not improving. Call or return to clinic prn if these symptoms  worsen or fail to improve as anticipated.    - cyclobenzaprine (FLEXERIL) 10 MG tablet; Take 1 tablet by mouth nightly as needed  Dispense: 15 tablet; Refill: 0    5. Decreased sex drive  To come in before 10 AM for test.     - COLLECTION VENOUS BLOOD VENIPUNCTURE; Future  - TESTOSTERONE TOTAL; Future  - TESTOSTERONE FREE; Future    6. Malaise and fatigue  Discussed that this could possibly be related to depression, patient agrees but would like blood tests completed.     - THYROID SCREEN TSH REFLEX FT4; Future  - VITAMIN D,25 HYDROXY; Future  - COLLECTION VENOUS BLOOD VENIPUNCTURE; Future  - CBC WITH PLATELET; Future    Follow up if any worsening, no improvement,. Any other concerns or complaints.

## 2016-10-15 ENCOUNTER — Encounter (HOSPITAL_BASED_OUTPATIENT_CLINIC_OR_DEPARTMENT_OTHER): Payer: Self-pay | Admitting: Medical

## 2016-10-15 DIAGNOSIS — F5104 Psychophysiologic insomnia: Principal | ICD-10-CM

## 2016-10-15 MED FILL — MELATONIN 3MG: 30 days supply | Qty: 30 | Fill #1 | Status: CP

## 2016-10-15 NOTE — Telephone Encounter (Signed)
Med on file with Pharmacy:  No refill is required at this time.

## 2016-10-15 NOTE — Telephone Encounter (Signed)
Message from MyChart:  Paul HoppingMarcio Dack would like a refill of the following medications:     melatonin 3 MG TABS tablet [name][Nicole Gaden, PA-C]    Preferred pharmacy: [facility] [place], [place] - [address]

## 2016-11-20 ENCOUNTER — Encounter (HOSPITAL_BASED_OUTPATIENT_CLINIC_OR_DEPARTMENT_OTHER): Payer: Self-pay | Admitting: Ophthalmology

## 2016-11-20 ENCOUNTER — Ambulatory Visit (HOSPITAL_BASED_OUTPATIENT_CLINIC_OR_DEPARTMENT_OTHER): Payer: Medicaid Other | Admitting: Ophthalmology

## 2016-11-20 DIAGNOSIS — H5203 Hypermetropia, bilateral: Principal | ICD-10-CM

## 2016-11-20 DIAGNOSIS — H524 Presbyopia: Secondary | ICD-10-CM

## 2016-11-20 DIAGNOSIS — H04129 Dry eye syndrome of unspecified lacrimal gland: Secondary | ICD-10-CM

## 2016-11-20 MED ORDER — CARBOXYMETHYLCELLULOSE SODIUM 0.5 % OP SOLN: 1 [drp] | mL | Freq: Four times a day (QID) | OPHTHALMIC | 11 refills | 0 days | Status: AC | PRN

## 2016-11-20 MED ORDER — CARBOXYMETHYLCELLULOSE SODIUM 0.5 % OP SOLN
1.0000 [drp] | Freq: Four times a day (QID) | OPHTHALMIC | 11 refills | Status: AC | PRN
Start: 2016-11-20 — End: 2017-11-20

## 2016-11-20 NOTE — Progress Notes (Signed)
Impression:  Dry eye symptoms  Presbyopia    Plan:  Rx given for spectacle lenses  Reassured of ocular health  Tears OD prn irritation  Observe  See 2 years.

## 2016-11-20 NOTE — Nursing Note (Signed)
Last apt.was  1/16 F/U dry eys /Hyper.ou    C/o watery eyes OD for long time , probl. reading in the near ou

## 2016-11-20 NOTE — Nursing Note (Signed)
Tearing on and off OD for a long time.  Eye feels irritated from time to time.

## 2017-05-29 ENCOUNTER — Telehealth (HOSPITAL_BASED_OUTPATIENT_CLINIC_OR_DEPARTMENT_OTHER): Payer: Self-pay

## 2017-05-29 NOTE — Progress Notes (Unsigned)
NURSE  from [facility]  called the Central Refill Department to complete a benefit analysis for the T DAP  Vaccine.    The vaccine is covered under the patient’s HSN  medical coverage.    Please choose Private

## 2017-05-29 NOTE — Telephone Encounter (Signed)
-----   Message from Raymon MuttonMaria F Rodrigues sent at 05/29/2017  1:34 PM EST -----  Patient has appointment  Soon needs benefit review for:  TDAP

## 2017-06-01 ENCOUNTER — Ambulatory Visit (HOSPITAL_BASED_OUTPATIENT_CLINIC_OR_DEPARTMENT_OTHER): Payer: Medicaid Other | Admitting: Family Medicine

## 2017-06-10 ENCOUNTER — Ambulatory Visit (HOSPITAL_BASED_OUTPATIENT_CLINIC_OR_DEPARTMENT_OTHER): Payer: Medicaid Other | Admitting: Family Medicine

## 2017-06-10 VITALS — BP 120/80 | HR 94 | Temp 97.8°F | Wt 238.0 lb

## 2017-06-10 DIAGNOSIS — F3341 Major depressive disorder, recurrent, in partial remission: Secondary | ICD-10-CM

## 2017-06-10 DIAGNOSIS — H6123 Impacted cerumen, bilateral: Secondary | ICD-10-CM

## 2017-06-10 DIAGNOSIS — F4010 Social phobia, unspecified: Secondary | ICD-10-CM

## 2017-06-10 DIAGNOSIS — R42 Dizziness and giddiness: Principal | ICD-10-CM

## 2017-06-10 DIAGNOSIS — N529 Male erectile dysfunction, unspecified: Secondary | ICD-10-CM

## 2017-06-10 DIAGNOSIS — G4700 Insomnia, unspecified: Secondary | ICD-10-CM

## 2017-06-10 DIAGNOSIS — S46219A Strain of muscle, fascia and tendon of other parts of biceps, unspecified arm, initial encounter: Secondary | ICD-10-CM

## 2017-06-10 MED ORDER — CARBAMIDE PEROXIDE 6.5 % OT SOLN
5.00 [drp] | Freq: Two times a day (BID) | OTIC | 0 refills | Status: AC
Start: 2017-06-10 — End: 2017-06-17

## 2017-06-10 MED ORDER — CARBAMIDE PEROXIDE 6.5 % OT SOLN: 5 [drp] | Bottle | Freq: Two times a day (BID) | OTIC | 0 refills | 0 days | Status: AC

## 2017-06-10 MED ORDER — TRAZODONE HCL 50 MG PO TABS: tablet | ORAL | 6 refills | 0 days | Status: AC

## 2017-06-10 MED ORDER — TRAZODONE HCL 50 MG PO TABS
ORAL_TABLET | ORAL | 6 refills | Status: DC
Start: 2017-06-10 — End: 2018-11-29

## 2017-06-10 MED FILL — MURINE EAR DRO 6.5% OT: 12 days supply | Qty: 15 | Fill #0 | Status: CP

## 2017-06-10 MED FILL — TRAZODONE  50MG: 30 days supply | Qty: 30 | Fill #0 | Status: CP

## 2017-06-10 NOTE — Progress Notes (Addendum)
Pt w/ Review of Patient's Allergies indicates:  No Known Allergies      Current Outpatient Medications:  carboxymethylcellulose (REFRESH TEARS) 0.5 % SOLN Place 1 drop into both eyes 4 (four) times daily as needed Disp: 30 mL Rfl: 11   naproxen (NAPROSYN) 500 MG tablet Take 1 tablet by mouth 2 (two) times daily with meals. As needed for pain Disp: 42 tablet Rfl: 0   sildenafil (VIAGRA) 50 MG tablet Take 1 tablet by mouth as needed for Erectile dysfunction. Disp: 8 tablet Rfl: 3     No current facility-administered medications for this visit.   Patient Active Problem List:     Anal fissure     Allergic rhinitis due to pollen     Major depression, recurrent (HCC)     Social anxiety disorder     Hyperopia     Right shoulder pain     Tendinopathy of rotator cuff     Scapular dyskinesis     Serum total bilirubin elevated    Presents for evaluation of dizziness.    Reports dizziness started 2 weeks ago, at first "very strong" for 3-4 days, "very hard" for the first week.  Exacerbated by standing up quickly, sudden head movements.  Now experiencing mild dizziness w/ sudden movements; denies being dizzy in office today.  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  No notable events prior to onset but reports significant stress in the past 2 weeks - "overthinking" things.   He wants to go visit family in [place] as he has not been able to visit for the last 18 yrs, but is is scared about not being able to come back given current political environment.    Denies headache, CP, palps, SOB/dyspnea, lower extremity swelling.  Denies fever, chills, n/v/d.   Denies otalgia, hearing difficulties, tinnitus, drainage from ear canals.  Denies preceding URI.    Also reports insomnia, difficulty falling asleep, getting 3-4 hours of sleep a night. Taking 10 mg melatonin nightly at 10p, does not feel that this is helpful.    Also reports mild-moderate pain in his L bicep after  pulling a muscle at the gym about 4 weeks ago.  Pain is exacerbated by lifting his arm, has continued going to the gym.  Reports he was taking 200 mg of ibuprofen q12 hours for a total of 200-400 mg per day for 2-3 days, last dose ~3 weeks ago because he wasn't sure if this was the right medication to be taking.    Objective     BP 120/80  Pulse 94  Temp 97.8 F (36.6 C) (Temporal)  Wt 108 kg (238 lb)  SpO2 98%  BMI 28.22 kg/m2     Physical exam  General: Well-developed, well-nourished, not in distress, gait normal.  HEENT: AT/NC. PERRL, bl conj nl. External nose normal. Bl TMs unable to be visualized 2/2 cerumen impaction.  Cor: RRR, no m/g/r.  Extrem: Bl 5/5 strength upper & lower extremities. FROM x4. Reports mild pain on lateral surface of L upper arm c/ L shoulder abduction/adduction above 90 degrees, no tenderness to palpation, no crepitus.  Neuro: A&Ox3.  Psych: Mood & affect appropriate. Judgement nl.     >25 min spent face-to-face w/ pt, >50% time spent counseling, coordinating care.    Assessment / Plan    (R42) Dizziness  (primary encounter diagnosis)  Plan: Not symptomatic in office.   DDX discussed.  Suspect BPPV, probable anxiety component, possibly related to impacted cerumen.  Doubt resolving vestibular neuritis.  Med ed, SE, worry sx discussed; pt declines meclizine rx.  RTC in one month, sooner PRN concerns and/or worry sx (inc. Syncope, CP, SOB, palps, worsening of sx).  Pt verbalizes understanding & agrees to plan.    (G47.00) Insomnia, unspecified type  Comment: Likely related to anxiety.  Plan: Resume traZODone (DESYREL) 50 MG tablet as pt reports this worked well for him in the past.  Med ed, SE, worry sx discussed.  RTC in one month, sooner PRN concerns and/or worry sx.  Pt verbalizes understanding & agrees to plan.    (H61.23) Bilateral impacted cerumen  Comment: Possibly contributing to dizziness.   Plan: Trial Debrox ear drops daily x1 week, RTC .   Med ed, SE, worry sx discussed.  RTC  for ear irrigation next week (nurse visit), sooner PRN concerns and/or worry sx (inc. increased dizziness, ear pain).  Pt verbalizes understanding & agrees to plan.    (N52.9) Erectile dysfunction, unspecified erectile dysfunction type  Comment: Not taking Viagra.   Plan: Viagra not contributing to current issue.  Declines med management at the moment.  RTC PRN concerns and/or worry sx (inc. Syncope, priaprism).  Pt verbalizes understanding & agrees to plan.    (F33.41) Recurrent major depressive disorder, in partial remission (HCC)  Comment: History of; possibly contributing to symptoms.  Plan: As above.    (F40.10) Social anxiety disorder  Comment: History of; possibly contributing to symptoms.  Plan: As above.      [name]

## 2017-06-17 ENCOUNTER — Ambulatory Visit (HOSPITAL_BASED_OUTPATIENT_CLINIC_OR_DEPARTMENT_OTHER): Payer: Medicaid Other | Admitting: Licensed Practical Nurse

## 2017-06-17 DIAGNOSIS — H6123 Impacted cerumen, bilateral: Principal | ICD-10-CM

## 2017-06-17 NOTE — Progress Notes (Signed)
After using ear drops at home, patient here for   Bilat ear irrigation. Both ears irrigated   with warm tap water.  Right ear with small  amounts cerumen dislodged. Left ear with minimal amounts dislodged. Patient tolerated well , advised to follow up with pcp if pain or irritation present.

## 2017-07-20 ENCOUNTER — Telehealth (HOSPITAL_BASED_OUTPATIENT_CLINIC_OR_DEPARTMENT_OTHER): Payer: Self-pay | Admitting: Family Medicine

## 2017-07-20 ENCOUNTER — Ambulatory Visit (HOSPITAL_BASED_OUTPATIENT_CLINIC_OR_DEPARTMENT_OTHER): Payer: Medicaid Other | Admitting: Family Medicine

## 2017-07-20 ENCOUNTER — Encounter (HOSPITAL_BASED_OUTPATIENT_CLINIC_OR_DEPARTMENT_OTHER): Payer: Self-pay | Admitting: Family Medicine

## 2017-07-20 VITALS — BP 136/82 | HR 66 | Temp 97.5°F | Ht 77.0 in | Wt 240.0 lb

## 2017-07-20 DIAGNOSIS — M25512 Pain in left shoulder: Secondary | ICD-10-CM

## 2017-07-20 DIAGNOSIS — R5383 Other fatigue: Principal | ICD-10-CM

## 2017-07-20 DIAGNOSIS — Z23 Encounter for immunization: Secondary | ICD-10-CM

## 2017-07-20 MED ORDER — NAPROXEN SODIUM 550 MG PO TABS
550.0000 mg | ORAL_TABLET | Freq: Two times a day (BID) | ORAL | 1 refills | Status: DC
Start: 2017-07-20 — End: 2018-11-29

## 2017-07-20 MED ORDER — NAPROXEN SODIUM 550 MG PO TABS: 550 mg | tablet | Freq: Two times a day (BID) | ORAL | 1 refills | 0 days | Status: AC

## 2017-07-20 MED FILL — BOOSTRIX INJ: 1 days supply | Qty: 1 | Fill #0 | Status: CP

## 2017-07-20 MED FILL — NAPROXEN SOD 550MG: 30 days supply | Qty: 60 | Fill #0 | Status: CP

## 2017-07-20 NOTE — Progress Notes (Signed)
Pt seen by, examined by, case discussed with, and plan discussed w/ me, Dr. Tilak Oakley

## 2017-07-20 NOTE — Progress Notes (Signed)
CC:  Paul HoppingMarcio Mccarthy is a 42 year old male with complaints of left shoulder pain x 4 months, daytime fatigue and decreased libido.          Patient Active Problem List:     Anal fissure     Allergic rhinitis due to pollen     Major depression, recurrent (HCC)     Social anxiety disorder     Hyperopia     Right shoulder pain     Tendinopathy of rotator cuff     Scapular dyskinesis     Serum total bilirubin elevated      HPI:  Left Shoulder: Paul HoppingMarcio Fout is a 42 year old male with a history of left shoulder pain x 4-5 months. Pain began suddenly while performing a heavy weight upright row while at the gym. 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 He has discontinued lifting for the past two months. He took Advil off and on for 2 weeks but discontinued this several weeks ago as it was not alleviating his symptoms. Reports occaionaly radiculopathy with overhead movements that does not extend beyond the elbow. Denies cervicalgia or trauma to the head or neck. Denies weakness, muscle atrophy, numbness, decreased motor control in the left arm.    Fatigue: Patient is also concerned for low testosterone. He complains of unexplained daytime fatigue, decreased libido, difficulty obtaining and maintaining an erection. Denies muscle loss or weakness. He does admit to only 4-5 hours of sleep each night but does not otherwise show signs of OSA. He has had a negative TSH in the recent past and has not been tested for testosterone.    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 As needed for pain Disp: 42 tablet Rfl: 0   sildenafil (VIAGRA) 50 MG tablet Take 1 tablet by mouth as needed for Erectile dysfunction. Disp: 8 tablet Rfl: 3     No current facility-administered medications on file prior to visit.     FH: Reviewed in Epic    SH: Reviewed in Epic    ROS: Pertinent positives and negatives as described in HPI.     GU: no problems urinating. Decreased libido. Difficulty obtaining and maintaining an erection.   Derm: no rashes, no skin problems.  Muscular: Left shoulder pain with overhead movement. Denies weakness or muscle atrophy. Localized radiculopathy in the left shoulder to mid-upper arm with overhead movements.  Neuro: no headaches, no weakness, no dizziness, no altered sensation, no LOC.    Vital Signs:  BP 136/82 (Site: LA, Position: Sitting, Cuff Size: Lrg)  Pulse 66  Temp 97.5 F (36.4 C) (Temporal)  Ht 6\' 5"  (1.956 m)  Wt 108.9 kg (240 lb)  SpO2 96%  BMI 28.46 kg/m2    Physical Exam:  General: well-appearing adult male, no  acute distress.  Derm: no discoloration, no rashes.  Neuro: Normal tone, power, reflexes in all 4 limbs.  Locomotor: moving all four limbs appropriately. Shoulders symmetrical in appearance. Full passive/active ROM b/L. There is no tenderness to palpation of the left shoulder. Pain with left shoulder active abduction > 120 degrees. Hawkin's Kennedy positive for impingement. There is no shoulder instability or weakness.        Assessment/Plan:    1. Tired  Screening negative for sleep apnea. 4-5 hours of sleep a night is likely contributing to daytime fatigue but the patient is also complaining of decreased libido and difficulty obtaining/maintaining an erection. TSH recently normal. Testosterone is ordered for further evaluation.   - TESTOSTERONE TOTAL; Future  - COLLECTION VENOUS BLOOD VENIPUNCTURE; Future    2. Acute Pain of Left  Shoulder  Left shoulder pain x 4-5 months with a most likely diagnosis of left shoulder impingement syndrome. Recommend a 10-14 day course of Naproxen PO BID with food. Discussed and reviewed several stretches and rotator cuff strengthening exercises to perform twice a day, 3-4 days a week. Return to gym exercises as tolerated. Recommend avoiding upright rows in the future due to the risk for repeat injury. If symptoms are not improving in 3-4 weeks, Mr Eastham can be referred to Physical Therapy for further evaluation and treatment.       Marguarite Arbour, PA-C, 07/20/2017, 2:30 PM

## 2017-07-20 NOTE — Telephone Encounter (Signed)
-----   [facility] sent at 07/20/2017  2:07 PM EDT -----  Patient is here now for visit - Please run benefit review for TDAP

## 2017-07-20 NOTE — Progress Notes (Signed)
Nurse from [facility] called the Central Refill Department to complete a benefit analysis for the TDAP Vaccine.    The vaccine is covered under the patient’s prescription coverage.    Please choose Prior Auth

## 2017-07-20 NOTE — Progress Notes (Signed)
07/20/2017  VIS given prior to administration and reviewed with the patient and or legal guardian. Patient understands the disease and the vaccine. See immunization/Injection module or chart review for date of publication and additional information.  Anasia Agro RN    Vaccines administered today:  TDAP  Yliana Gravois RN, 07/20/2017, 2:32 PM

## 2017-07-23 ENCOUNTER — Other Ambulatory Visit (HOSPITAL_BASED_OUTPATIENT_CLINIC_OR_DEPARTMENT_OTHER): Payer: Self-pay | Admitting: Family Medicine

## 2017-07-23 ENCOUNTER — Ambulatory Visit (HOSPITAL_BASED_OUTPATIENT_CLINIC_OR_DEPARTMENT_OTHER): Payer: Medicaid Other

## 2017-07-23 DIAGNOSIS — E349 Endocrine disorder, unspecified: Principal | ICD-10-CM

## 2017-07-23 DIAGNOSIS — R5383 Other fatigue: Principal | ICD-10-CM

## 2017-07-23 NOTE — Progress Notes (Signed)
Released open orders.  Routine blood draw, 1SST tube.  Prepared specimen for courier.  [name], 07/23/2017, 8:48 AM

## 2017-07-23 NOTE — Progress Notes (Signed)
Add on

## 2017-07-24 LAB — TESTOSTERONE FREE: TESTOSTERONE FREE: 3.3 pg/mL — AB (ref 6.8–21.5)

## 2017-07-28 ENCOUNTER — Other Ambulatory Visit (HOSPITAL_BASED_OUTPATIENT_CLINIC_OR_DEPARTMENT_OTHER): Payer: Self-pay | Admitting: Family Medicine

## 2017-07-28 DIAGNOSIS — R7989 Other specified abnormal findings of blood chemistry: Principal | ICD-10-CM

## 2017-07-28 LAB — LUTEINIZING HORMONE (LH): LUTEINIZING HORMONE (LH): 4.3 m[IU]/mL (ref 1.2–10.6)

## 2017-07-28 LAB — FOLLICLE STIMULATING HORMONE: FOLLICLE STIMULATING HORMONE: 2.8 m[IU]/mL (ref 0.7–10.8)

## 2017-07-28 LAB — TESTOSTERONE TOTAL: TESTOSTERONE TOTAL: 169 ng/dL — ABNORMAL LOW (ref 264–916)

## 2017-07-29 ENCOUNTER — Encounter (HOSPITAL_BASED_OUTPATIENT_CLINIC_OR_DEPARTMENT_OTHER): Payer: Self-pay | Admitting: Family Medicine

## 2017-07-29 DIAGNOSIS — R7989 Other specified abnormal findings of blood chemistry: Secondary | ICD-10-CM | POA: Insufficient documentation

## 2017-07-30 ENCOUNTER — Telehealth (HOSPITAL_BASED_OUTPATIENT_CLINIC_OR_DEPARTMENT_OTHER): Payer: Self-pay | Admitting: Family Medicine

## 2017-07-30 ENCOUNTER — Encounter (HOSPITAL_BASED_OUTPATIENT_CLINIC_OR_DEPARTMENT_OTHER): Payer: Self-pay | Admitting: Family Medicine

## 2017-07-30 NOTE — Progress Notes (Signed)
I called pt with results. [phone].  [facility]...............................................................................................................................................................................................................................................................................................  No answer message left.  I will mychart him.    The plan for the low testosterone (along with evaluation of the hormones) is that the treatment is better control of depression and anxiety and better (more) sleep.   Perhaps Katharine Look could get him started with these things. She has met him before.    Then we would recheck his testosterone in about 6 months.    I will mychart him.

## 2017-08-19 ENCOUNTER — Ambulatory Visit (HOSPITAL_BASED_OUTPATIENT_CLINIC_OR_DEPARTMENT_OTHER): Payer: Medicaid Other | Admitting: Family Medicine

## 2017-08-19 ENCOUNTER — Other Ambulatory Visit (HOSPITAL_BASED_OUTPATIENT_CLINIC_OR_DEPARTMENT_OTHER): Payer: Self-pay | Admitting: Internal Medicine

## 2017-08-19 ENCOUNTER — Encounter (HOSPITAL_BASED_OUTPATIENT_CLINIC_OR_DEPARTMENT_OTHER): Payer: Self-pay | Admitting: Family Medicine

## 2017-08-19 VITALS — BP 116/55 | HR 65 | Temp 97.0°F | Ht 77.0 in | Wt 238.0 lb

## 2017-08-19 DIAGNOSIS — E291 Testicular hypofunction: Principal | ICD-10-CM

## 2017-08-19 DIAGNOSIS — R7989 Other specified abnormal findings of blood chemistry: Secondary | ICD-10-CM

## 2017-08-19 DIAGNOSIS — M25512 Pain in left shoulder: Principal | ICD-10-CM

## 2017-08-19 DIAGNOSIS — G8929 Other chronic pain: Principal | ICD-10-CM

## 2017-08-19 DIAGNOSIS — F3341 Major depressive disorder, recurrent, in partial remission: Secondary | ICD-10-CM

## 2017-08-19 MED ORDER — VENLAFAXINE HCL ER 37.5 MG PO CP24: 38 mg | capsule | Freq: Every day | ORAL | 6 refills | 0 days | Status: AC

## 2017-08-19 MED ORDER — VENLAFAXINE HCL ER 37.5 MG PO CP24
37.5000 mg | ORAL_CAPSULE | Freq: Every day | ORAL | 6 refills | Status: DC
Start: 2017-08-19 — End: 2018-11-29

## 2017-08-19 MED FILL — VENLAFAXINE  37.5MG ER: 30 days supply | Qty: 60 | Fill #0 | Status: CP

## 2017-08-19 NOTE — Progress Notes (Signed)
SUBJECTIVE:  42 year old male  presents for f/u shoulder and low T and anxiety.  Very stressed; work and would like to move back to [place] sometime.  He has decreased desire, low libido. Erection does not become entirely firm. And even w/ gfriend, often w/ difficulty erection; and again low desire.  Is able to ejacuation.  Feeling tired and weak all the time. Sleeps 5 hours per night. Wakes up middle of night, thouths worrying him, then can take over an hour to fall back to sleep.   And wakes up tired.  No trauma to area.     Shoulder is the same. Still at gym, but avoiding those things that were aggrevating it. Would like to do PT at this point  Social History    Tobacco Use      Smoking status: Never Smoker      Smokeless tobacco: Never Used    Alcohol use: Yes      Alcohol/week: 2.5 oz      Types: 5 Standard drinks or equivalent per week      Comment: on weekends    Drug use: No       Drug use: No      Social History    Social History Narrative      From [place]. In [place] since 2001.             Is singe, not married, lives w/ brother and sister-in-law.      08/21/2014: now dating, this helps with the mood.      Works IT trainer.       And sells eucalyptus trees. 2015: the prices crashed ,very difficult for patient.            Abuse: no. But had a very tough time in [place] for about 5 years in the 90s - hard work, very busy, and mother died.      Seatb: yes      Ativities: goes to gym      Hobbies:not much , "i don't have much entertainment".     Review of patient's family history indicates:  Problem: Diabetes      Relation: Unknown          Age of Onset: (Not Specified)          Comment: none  Problem: Heart      Relation: Mother          Age of Onset: (Not Specified)          Comment: MI age 18  Problem: Hypertension      Relation: Brother          Age of Onset: (Not Specified)  Problem: Cancer - Other      Relation: Unknown          Age of Onset: (Not Specified)          Comment: none     OBJECTIVE:  general: alert, well appearing, no distress   BP 116/55 (Site: LA, Position: Sitting, Cuff Size: Lrg)  Pulse 65  Temp 97 F (36.1 C) (Temporal)  Ht  (1.956 m)  Wt 108 kg (238 lb)  SpO2 96%  BMI 28.22 kg/m2   Left shoulder tendern deltoid region and pain w/ abd against rsistnace.  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  discussed options  Plan: effexor, can mychart in about two weeks  Verbal parq done.     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  Plan: discussed importance of treatig the anxiety and dysthymia.   See above  Also, he is interested in considering testost replacment therapy. I discussed with him this can have potential negative effects, so need to be cautious. However, given his low libido, ED, and subjective tiredness and weakness, we will go ahead and do an endocrine e-consult.       We discussed the patients medications. The patient/guardian expressed understanding and no barriers to adherence were identified.    The patient/guardian understands and agrees with the plan.  I have spent 25 minutes in face to face time with this patient/patient proxy of which > 50% was in counseling or coordination of care regarding above issues/Dx.

## 2017-08-19 NOTE — e-consult (Signed)
Endocrinology e-Consultation    Thank you for referring your patient for an e-consultation. I have not interviewed or examined the patient. My recommendations here are based on the review of relevant clinical information and information provided by the referring provider.    Consultation request:   Low Testosterone. ?TRT candidate?    42 year old male  Estimated body mass index is 28.22 kg/m as calculated from the following:  H/o anxiety and dysthimia  occaisonal etoh, no smoking.  No h/o groin trauma  ED, low libido, subjective tiredness and weakness.  Component                           Latest Ref Rng & Units          07/23/2017         TESTOSTERONE TOTAL                      264 - 916 ng/dL             604 (L)          TESTOSTERONE FREE                       6.8 - 21.5 pg/mL             3.3 (A)          FOLLICLE STIMULATING HORMONE                 0.7 - 10.8 mIU/mL            2.8            LUTEINIZING HORMONE (LH)                   1.2 - 10.6 mIU/mL            4.3              For further correspondence/information please refer to the contact information below:    Vinnie Langton     Case Summary:    This is a 42 year old male complaining about low libido and  Low energy level.  Morning testosterone was low together with inappropriately normal FSH and LH.  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  This low libido may or may not be related to the low testosterone given that Corian Handley is on medications affecting central nervous system.  Regardless, this needs to be investigated further and I would suggest to repeat testosterone as well as some of the other laboratory tests.  It is not clear about his etiology and I do think that needs to be clarified before we can proceed with  potential treatment.  Testosterone replacement therapy is certainly 1 of the options however that is likely to cause permanent endogenous testosterone suppression and Inocencio Roy will be relying on the replacement therapy lifelong.  Alternative approach is to consider off label use of Clomid which may boost his endogenous testosterone but that approach can only be used once we have ruled out any obvious pathological conditions.    Recommendations:  1.   I would suggest to obtain morning total testosterone, albumin, sex hormone binding globulin, TFT, LH, CMP, and prolactin.  2.   I am curious whether the patient has used anabolic steroids in the past   3.  Please refer the patient to endocrine service  for office visit    Please let me know whether I can be of further assistance and please let me know how the patient responds to your management.    Sincerely yours,    Chelly Dombeck H. Celestine Prim, MD    Patient Disposition : REGULAR FOLLOW UP VISIT NEEDED    Time spent on this e-Consultation :  Less than 15 min

## 2017-08-21 ENCOUNTER — Encounter (HOSPITAL_BASED_OUTPATIENT_CLINIC_OR_DEPARTMENT_OTHER): Payer: Self-pay | Admitting: Family Medicine

## 2017-08-25 ENCOUNTER — Other Ambulatory Visit (HOSPITAL_BASED_OUTPATIENT_CLINIC_OR_DEPARTMENT_OTHER): Payer: Self-pay | Admitting: Family Medicine

## 2017-08-25 DIAGNOSIS — R7989 Other specified abnormal findings of blood chemistry: Principal | ICD-10-CM

## 2017-09-18 ENCOUNTER — Other Ambulatory Visit (HOSPITAL_BASED_OUTPATIENT_CLINIC_OR_DEPARTMENT_OTHER): Payer: Self-pay | Admitting: Family Medicine

## 2017-09-18 DIAGNOSIS — F411 Generalized anxiety disorder: Principal | ICD-10-CM

## 2017-09-18 DIAGNOSIS — F410 Panic disorder [episodic paroxysmal anxiety] without agoraphobia: Secondary | ICD-10-CM

## 2017-09-22 ENCOUNTER — Other Ambulatory Visit (HOSPITAL_BASED_OUTPATIENT_CLINIC_OR_DEPARTMENT_OTHER): Payer: Self-pay | Admitting: Psychiatry

## 2017-09-22 ENCOUNTER — Encounter (HOSPITAL_BASED_OUTPATIENT_CLINIC_OR_DEPARTMENT_OTHER): Payer: Self-pay | Admitting: Family Medicine

## 2017-09-22 MED FILL — VENLAFAXINE  37.5MG ER: 30 days supply | Qty: 60 | Fill #1 | Status: CP

## 2017-09-22 NOTE — Telephone Encounter (Signed)
Med on file with Pharmacy:  rph at [facility] [place] confirmed that effexor has  active refills remaining. No refill is required at this time.

## 2017-09-22 NOTE — e-consult (Signed)
Psychiatric Consultation Service E-Consult       Thank you for referring your patient for an e-consultation. I have not interviewed or examined the patient. My recommendations here are based on the review of relevant clinical information and information provided by the referring provider.    Referring Provider:  Vinnie LangtonKenneth Gerweck,MD     Relevant data and impression:  42 year old hx chronic recurrent anxiety and depression. Presently, it is severe anxiety that is effecting him most. Case complicated by chronic low libido and ED. We started him on effexor recently - he says has not helped yet with anxiety and has made his libido even lower.  He used to be followed by Dr Doreatha MartinBonilla.  Thank you,  Rocky LinkKen.     Case Summary:  Patient chart reviewed.  This is a 42 year old male with a history of generalized anxiety disorder.  Currently the anxiety is worsening and there is loss of libido as well on Effexor.  Patient also takes trazodone in the evening.  There is also a history of prescription of Viagra in the past. Patient has had a partial response to other SSRI/SSRIs from the history.       Recommendations:  1. Patient has been tried on Prozac Zoloft Wellbutrin as well as Paxil in the past. Unfortunately this is a difficult situation.  I see that the patient is tried Viagra in the past and this may be the only option.  We can try switching around ssRIs but given the treatment resistance and failure in the past I am not sure if these would be any more helpful.  We can certainly try a newer SSRI such as Vilazodone (Viibryd) and Vortioxetine that have lower sexual side effects.    2. Please taper off Effexor giving it on every alternate days now.  After about a week he is discontinue Effexor and start Viibryd.  He can start Viibryd 10 mg a day on a daily basis for the next 7 days.  The dose may be increased to 20 mg a day after that and then can be monitored if a higher dose is needed.Please feel free to page me if you need  further guidance.      Please feel free to contact me with further questions.    Sincerely yours,    Margie BilletShivkumar H. Vesna Kable, MD    Patient Disposition : REGULAR FOLLOW UP VISIT NEEDED    Time spent on this e-Consultation :  15 - 30 min

## 2017-09-30 ENCOUNTER — Other Ambulatory Visit (HOSPITAL_BASED_OUTPATIENT_CLINIC_OR_DEPARTMENT_OTHER): Payer: Self-pay | Admitting: Family Medicine

## 2017-09-30 ENCOUNTER — Encounter (HOSPITAL_BASED_OUTPATIENT_CLINIC_OR_DEPARTMENT_OTHER): Payer: Self-pay | Admitting: Family Medicine

## 2017-09-30 DIAGNOSIS — R7989 Other specified abnormal findings of blood chemistry: Principal | ICD-10-CM

## 2017-10-29 ENCOUNTER — Ambulatory Visit: Payer: No Typology Code available for payment source | Attending: Family Medicine

## 2017-10-29 DIAGNOSIS — R7989 Other specified abnormal findings of blood chemistry: Secondary | ICD-10-CM | POA: Diagnosis present

## 2017-10-29 LAB — COMPREHENSIVE METABOLIC PANEL
ALANINE AMINOTRANSFERASE: 47 U/L — ABNORMAL HIGH (ref 12–45)
ALBUMIN: 3.8 g/dL (ref 3.4–5.0)
ALKALINE PHOSPHATASE: 129 U/L — ABNORMAL HIGH (ref 45–117)
ANION GAP: 5 mmol/L (ref 5–15)
ASPARTATE AMINOTRANSFERASE: 28 U/L (ref 8–34)
BILIRUBIN TOTAL: 1.1 mg/dL — ABNORMAL HIGH (ref 0.2–1.0)
BUN (UREA NITROGEN): 16 mg/dL (ref 7–18)
CALCIUM: 8.6 mg/dL (ref 8.5–10.1)
CARBON DIOXIDE: 27 mmol/L (ref 21–32)
CHLORIDE: 107 mmol/L (ref 98–107)
CREATININE: 1.1 mg/dL (ref 0.7–1.2)
ESTIMATED GLOMERULAR FILT RATE: >60 mL/min (ref >60)
Glucose Random: 135 mg/dL (ref 74–160)
POTASSIUM: 4.2 mmol/L (ref 3.5–5.1)
SODIUM: 139 mmol/L (ref 136–145)
TOTAL PROTEIN: 7 g/dL (ref 6.4–8.2)

## 2017-10-29 LAB — TESTOSTERONE TOTAL: TESTOSTERONE TOTAL: 209 ng/dL — ABNORMAL LOW (ref 264–916)

## 2017-10-29 LAB — PROLACTIN: PROLACTIN: 6.1 ng/mL (ref 2.5–17.4)

## 2017-10-29 LAB — LUTEINIZING HORMONE (LH): LUTEINIZING HORMONE (LH): 3.9 m[IU]/mL (ref 1.2–10.6)

## 2017-10-29 LAB — THYROID SCREEN TSH REFLEX FT4: THYROID SCREEN TSH REFLEX FT4: 1.23 u[IU]/mL (ref 0.358–3.740)

## 2017-10-29 NOTE — Progress Notes (Signed)
Released open orders.  Routine blood draw, 4 sst tube.  Prepared specimens for courier.  Paul [place]cc[place], [place], 10/29/2017

## 2017-10-30 LAB — SEX HORMONE BINDING GLOBULIN: SEX HORMONE BINDING GLOBULIN: 22 nmol/L (ref 16.5–55.9)

## 2017-11-09 ENCOUNTER — Encounter (HOSPITAL_BASED_OUTPATIENT_CLINIC_OR_DEPARTMENT_OTHER): Payer: Self-pay | Admitting: Family Medicine

## 2017-12-28 ENCOUNTER — Ambulatory Visit: Payer: No Typology Code available for payment source | Attending: Family Medicine | Admitting: Internal Medicine

## 2017-12-28 ENCOUNTER — Encounter (HOSPITAL_BASED_OUTPATIENT_CLINIC_OR_DEPARTMENT_OTHER): Payer: Self-pay | Admitting: Internal Medicine

## 2017-12-28 VITALS — BP 112/74 | HR 53 | Temp 97.4°F | Wt 230.0 lb

## 2017-12-28 DIAGNOSIS — R7989 Other specified abnormal findings of blood chemistry: Secondary | ICD-10-CM | POA: Diagnosis not present

## 2017-12-28 NOTE — Progress Notes (Signed)
Cecille Amsterdam, MD  [address]  [place], [place] [address]    Dear Dr.Gerweck    It was indeed a great pleasure having had this opportunity to see your patient Paul Mccarthy in the endocrinology for the initial consultation regarding the following endocrine-related disorders at your kind request.    Low testosterone     Patient's information was collected through a careful review of previous record prior to patient arrival as well as the office interview.     HPI:    Paul Mccarthy is a 42 year old male.  His past medical history is significant for anxiety and depression.  Paul Mccarthy does have baby girl that Paul Mccarthy eats 63 months of age.  [name] used testosterone in 2016 when [name] was working in [employer]  Stopped that about 2 months later and started to complain about fatigue low energy, erectile dysfunction and low libido.  This is improved somewhat and [name] demanded to be checked for testosterone recently that showed low testosterone.  I provided E consult for this patient in May 2019 and requesting a laboratory test that was done.  Paul Mccarthy is still not feeling back to his normal baseline.  [name] is in endocrine service for an official consultation.  [name] is not exactly sure whether [name] could like to have Mccarthy children.  His wife has to go through IVF process.    Patient's past medical history, family history, social history, current medication as well as allergy were also reviewed and updated.     PAST MEDICAL HISTORY.  Patient Active Problem List:     Anal fissure     Allergic rhinitis due to pollen     Major depression, recurrent (HCC)     Social anxiety disorder     Hyperopia     Right shoulder pain     Tendinopathy of rotator cuff     Scapular dyskinesis     Serum total bilirubin elevated     Low testosterone      PAST SURGICAL HISTORY.  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  Current Outpatient  Medications   Medication Sig    venlafaxine (EFFEXOR-XR) 37.5 MG 24 hr capsule Take 1 capsule by mouth daily For a week, then increase to two tablets daily    naproxen sodium (ANAPROX) 550 MG tablet Take 1 tablet by mouth 2 (two) times daily with meals Then one tablet twice daily with food as needed for 10 days    traZODone (DESYREL) 50 MG tablet Half to one tablet at night as needed    naproxen (NAPROSYN) 500 MG tablet Take 1 tablet by mouth 2 (two) times daily with meals. As needed for pain    sildenafil (VIAGRA) 50 MG tablet Take 1 tablet by mouth as needed for Erectile dysfunction.     No current facility-administered medications for this visit.        ALLERGIES.  Review of Patient's Allergies indicates:  No Known Allergies    FAMILY HISTORY.  Review of patient's family history indicates:  Problem: Diabetes      Relation: Unknown          Age of Onset: (Not Specified)          Comment: none  Problem: Heart      Relation: Mother          Age of  Onset: (Not Specified)          Comment: MI age 72  Problem: Hypertension      Relation: Brother          Age of Onset: (Not Specified)  Problem: Cancer - Other      Relation: Unknown          Age of Onset: (Not Specified)          Comment: none      SOCIAL HISTORY.  Social History    Tobacco Use      Smoking status: Never Smoker      Smokeless tobacco: Never Used    Alcohol use: Yes      Alcohol/week: 2.5 oz      Types: 5 Standard drinks or equivalent per week      Comment: on weekends    Drug use: No      REVIEW OF SYSTEMS.   Endocrine  She has normal appetite and stable weight. She denies any heat or cold intolerance.   Constitution:   She denies fatigue, lack of energy, fevers, chills, weight loss or weakness.   Eyes.   She is negative for blurred or double vision, dryness, pain, or redness of eyes.   ENT:   She is negative for headaches, hearing loss, nose bleeds, or sore throat.   Cardiovascular.   She is negative for palpitations, chest pain, orthopnea,  claudication, or leg swelling.   Respiratory.  She denies any cough, shortness of breath, or dyspnea on exertion.   Gastrointestinal.   She denies any dysphagia, nausea, vomiting, abdominal pain, constipation or diarrhea.   Genitourinary.  She is negative for polyuria, polydipsia, nocturia, dysuria, hematuria, flank pain or difficulty in urination.  Musculoskeletal.  She denies any cramping, weakness, difficulty walking, myalgias, or joint, neck, or back pain.    Integumentary.  She denies rash, purple striae, easy bruising, abnormal hair growth or acne.  Neurological.   She is negative for tingling, numbness, imbalanced gait or falls.   Psychiatric.  She is negative for depression, anxiety, or suicidal ideation.  Hematologic/Lymphatic.  She denies any bleeding tendencies.  Allergic/Immunologic.  She denies any immune problems.   All other systems reviewed and negative.      __________________________________________________  PHYSICAL EXAMINATIONS:  BP 112/74 (Cuff Size: Lrg)  Pulse 53  Temp 97.4 F (36.3 C)  Wt 104.3 kg (230 lb)  SpO2 98%  BMI 27.27 kg/m2  Body mass index is 27.27 kg/m.  GENERAL: Not in acute distress. Well developed and well-nourished.   EYES: Examination of the eyes demonstrated full extraocular movements. Pupils are equal, round, reactive to light and accommodation. There was no lid lag or proptosis. Negative for visual field defect in the office confrontation test.   ENT: Clear with moist mucous membranes without hyperpigmentation or lesions.   NECK: Negative for goiter. No cervical lymphadenopathy was appreciated. Negative for dorsalcervical fat deposition. Negative for acanthosis nigricans and skin tags.   CARDIOVASCULAR: Regular rate and rhythm, normal S1 and S2. Negative for murmur/rubs/gallop  CHEST: Clear to auscultation and percussion. Negative for respiratory distress/rales. Negative for chest tenderness.     GASTROINTESTINAL: Positive bowel sound, soft, nontender. Negative for  hepatomegaly.    MUSCULOSKELETAL: Without clubbing, cyanosis or edema. Muscle strength was 5/5 bilateral upper and lower extremities.   SKIN: cool and dry to touch without obvious lesions. Negative for purple striae.   NEUROLOGICAL: Intact with no tremor of the outstretched fingers.  PSYCHIATRIC: Normal affect  and responsiveness to questions.       LABORATORIES. -Reviewed.    Component      Latest Ref Rng & Units 10/29/2017   SODIUM      136 - 145 mmol/L 139   POTASSIUM      3.5 - 5.1 mmol/L 4.2   CHLORIDE      98 - 107 mmol/L 107   CARBON DIOXIDE      21 - 32 mmol/L 27   ANION GAP      5 - 15 mmol/L 5   CALCIUM      8.5 - 10.1 mg/dL 8.6   Glucose Random      74 - 160 mg/dL 161   BUN (UREA NITROGEN)      7 - 18 mg/dL 16   TOTAL PROTEIN      6.4 - 8.2 g/dL 7.0   ALBUMIN      3.4 - 5.0 g/dL 3.8   BILIRUBIN TOTAL      0.2 - 1.0 mg/dL 1.1 (H)   ALKALINE PHOSPHATASE      45 - 117 U/L 129 (H)   ASPARTATE AMINOTRANSFERASE      8 - 34 U/L 28   CREATININE      0.7 - 1.2 mg/dL 1.1   ESTIMATED GLOMERULAR FILT RATE      >60 ML/MIN > 60   ALANINE AMINOTRANSFERASE      12 - 45 U/L 47 (H)   PROLACTIN      2.5 - 17.4 ng/mL 6.1   LUTEINIZING HORMONE (LH)      1.2 - 10.6 mIU/mL 3.9   THYROID SCREEN TSH REFLEX FT4      0.358 - 3.740 uIU/mL 1.230   SEX HORMONE BINDING GLOBULIN      16.5 - 55.9 nmol/L 22.0   TESTOSTERONE TOTAL      264 - 916 ng/dL 096 (L)       ASSESSMENT    42 year old male with history of testosterone and anabolic steroid use, presents with symptomatic hypogonadism.  This low testosterone may or may not be related to the anabolic steroid use in 2016.  Emmogene Simson clearly has recovered somewhat since Corley Kohls was able to impregnate and his wife 1 year ago.  However Shi Blankenship is testosterone was indeed still low which may be used to explain his symptoms.  I do not think testosterone is a good option for him at this time since Gunter Conde is not exactly sure what Tandi Hanko could like to have Mccarthy children.  Jazzelle Zhang is LH was inappropriately normal given the low  testosterone, we have discussed about the plan and the first step is to have a pituitary MRI to make sure there is no processes in the pituitary gland that may be causing the issue, the chance is low but that need to be done to rule out the possibility before I can consider Clomid therapy.    PLANS:    1.  Pituitary MRI, ordered.  2.  Once the results is back, will consider starting on Clomid therapy  3.  Counseling against of any anabolic steroid use, I also recommend the patient to increase his resistance training.    Thank you for allowing me to particiate in the care of this interesting patient. I will continue to follow. Please let me know if you have further question I spent 45 minutes with the patient, Mccarthy than 50% of the time was on providing education counseling. .   All questions answered and concerns  addressed.    This encounter note was created using a voice recognition software. Please excuse any typographical error that have not yet been reviewed and corrected.    Sincerely       Eura Radabaugh H Ules Marsala, MD  ENDOCRINOLOGY, DIABETES, & METABOLISM

## 2017-12-28 NOTE — Progress Notes (Signed)
Patient feels physically safe at home.

## 2018-01-08 ENCOUNTER — Ambulatory Visit
Admission: RE | Admit: 2018-01-08 | Discharge: 2018-01-08 | Disposition: A | Discharge status: Home / Self Care | Payer: No Typology Code available for payment source | Attending: Internal Medicine | Admitting: Internal Medicine

## 2018-01-08 DIAGNOSIS — E230 Hypopituitarism: Secondary | ICD-10-CM | POA: Diagnosis not present

## 2018-01-08 DIAGNOSIS — E291 Testicular hypofunction: Secondary | ICD-10-CM | POA: Diagnosis not present

## 2018-01-08 DIAGNOSIS — R7989 Other specified abnormal findings of blood chemistry: Secondary | ICD-10-CM

## 2018-01-08 MED ORDER — SODIUM CHLORIDE 0.9 % IV BOLUS
20.00 mL | Freq: Once | INTRAVENOUS | Status: AC
Start: 2018-01-08 — End: 2018-01-08
  Administered 2018-01-08: 20 mL

## 2018-01-08 MED ORDER — GADOTERIDOL 279.3 MG/ML IV SOLN
1.00 mL | Freq: Once | INTRAVENOUS | Status: AC
Start: 2018-01-08 — End: 2018-01-08
  Administered 2018-01-08: 20 mL via INTRAVENOUS

## 2018-01-12 ENCOUNTER — Encounter (HOSPITAL_BASED_OUTPATIENT_CLINIC_OR_DEPARTMENT_OTHER): Payer: Self-pay | Admitting: Internal Medicine

## 2018-01-12 ENCOUNTER — Other Ambulatory Visit (HOSPITAL_BASED_OUTPATIENT_CLINIC_OR_DEPARTMENT_OTHER): Payer: Self-pay | Admitting: Internal Medicine

## 2018-01-12 DIAGNOSIS — E230 Hypopituitarism: Secondary | ICD-10-CM

## 2018-01-12 DIAGNOSIS — E291 Testicular hypofunction: Principal | ICD-10-CM

## 2018-01-12 MED ORDER — CLOMIPHENE CITRATE 50 MG PO TABS: 25 mg | tablet | Freq: Every day | ORAL | 1 refills | 0 days | Status: AC

## 2018-01-12 MED ORDER — CLOMIPHENE CITRATE 50 MG PO TABS
25.0000 mg | ORAL_TABLET | Freq: Every day | ORAL | 1 refills | Status: DC
Start: 2018-01-12 — End: 2018-05-31

## 2018-01-13 MED FILL — CLOMIPHENE  50MG: 30 days supply | Qty: 15 | Fill #0 | Status: CP

## 2018-02-19 ENCOUNTER — Encounter (HOSPITAL_BASED_OUTPATIENT_CLINIC_OR_DEPARTMENT_OTHER): Payer: Self-pay | Admitting: Internal Medicine

## 2018-02-20 ENCOUNTER — Encounter (HOSPITAL_BASED_OUTPATIENT_CLINIC_OR_DEPARTMENT_OTHER): Payer: Self-pay

## 2018-02-20 MED FILL — CLOMIPHENE  50MG: 30 days supply | Qty: 15 | Fill #1 | Status: CP

## 2018-02-20 NOTE — Telephone Encounter (Signed)
Med on file with Pharmacy:  rph at [facility] confirmed that clomid has  active refills remaining. No refill is required at this time.

## 2018-03-11 ENCOUNTER — Encounter (HOSPITAL_BASED_OUTPATIENT_CLINIC_OR_DEPARTMENT_OTHER): Payer: Self-pay | Admitting: Internal Medicine

## 2018-03-15 ENCOUNTER — Encounter (HOSPITAL_BASED_OUTPATIENT_CLINIC_OR_DEPARTMENT_OTHER): Payer: Self-pay | Admitting: Internal Medicine

## 2018-03-29 ENCOUNTER — Encounter (HOSPITAL_BASED_OUTPATIENT_CLINIC_OR_DEPARTMENT_OTHER): Payer: Self-pay | Admitting: Internal Medicine

## 2018-04-01 ENCOUNTER — Encounter (HOSPITAL_BASED_OUTPATIENT_CLINIC_OR_DEPARTMENT_OTHER): Payer: Self-pay | Admitting: Internal Medicine

## 2018-04-02 ENCOUNTER — Encounter (HOSPITAL_BASED_OUTPATIENT_CLINIC_OR_DEPARTMENT_OTHER): Payer: Self-pay

## 2018-04-02 NOTE — Telephone Encounter (Signed)
Med on file with Pharmacy:  [facility] confirmed that CLOMID has active refills remaining. No refill is required at this time.

## 2018-04-05 ENCOUNTER — Ambulatory Visit: Payer: No Typology Code available for payment source | Attending: Internal Medicine

## 2018-04-05 DIAGNOSIS — R7989 Other specified abnormal findings of blood chemistry: Secondary | ICD-10-CM | POA: Diagnosis present

## 2018-04-05 LAB — TESTOSTERONE TOTAL: TESTOSTERONE TOTAL: 525 ng/dL (ref 264–916)

## 2018-04-05 LAB — LUTEINIZING HORMONE (LH): LUTEINIZING HORMONE (LH): 4.4 m[IU]/mL (ref 1.2–10.6)

## 2018-04-05 NOTE — Progress Notes (Signed)
Released open orders.  Blood draw, 2 SST tube.  Prepared specimens for courier.  Jimma Ortman, [place], 04/05/2018

## 2018-04-06 LAB — SEX HORMONE BINDING GLOBULIN: SEX HORMONE BINDING GLOBULIN: 23.1 nmol/L (ref 16.5–55.9)

## 2018-05-31 ENCOUNTER — Encounter (HOSPITAL_BASED_OUTPATIENT_CLINIC_OR_DEPARTMENT_OTHER): Payer: Self-pay | Admitting: Internal Medicine

## 2018-05-31 ENCOUNTER — Ambulatory Visit: Payer: No Typology Code available for payment source | Attending: Family Medicine | Admitting: Internal Medicine

## 2018-05-31 VITALS — BP 136/79 | HR 77 | Wt 235.0 lb

## 2018-05-31 DIAGNOSIS — R7989 Other specified abnormal findings of blood chemistry: Secondary | ICD-10-CM | POA: Diagnosis present

## 2018-05-31 MED ORDER — CLOMIPHENE CITRATE 50 MG PO TABS: 25 mg | tablet | Freq: Every day | ORAL | 2 refills | 0 days | Status: AC

## 2018-05-31 MED ORDER — CLOMIPHENE CITRATE 50 MG PO TABS
25.0000 mg | ORAL_TABLET | Freq: Every day | ORAL | 2 refills | Status: DC
Start: 2018-05-31 — End: 2019-03-08

## 2018-05-31 MED FILL — CLOMIPHENE  50MG: 90 days supply | Qty: 45 | Fill #0 | Status: CP

## 2018-05-31 NOTE — Progress Notes (Signed)
Safe at home.-AA

## 2018-05-31 NOTE — Progress Notes (Signed)
This is a follow up visit regarding  Low testosterone  Paul Mccarthy was last seen on December 28, 2017.    HPI:    Paul Mccarthy is a 43 year old male.  His past medical history is significant for anxiety and depression.  Paul Mccarthy does have baby girl that Paul Mccarthy eats 23 months of age.  Paul Mccarthy used testosterone in 2016 when Paul Mccarthy was working in [employer]  Stopped that about 2 months later and started to complain about fatigue low energy, erectile dysfunction and low libido.  This is improved somewhat and Paul Mccarthy demanded to be checked for testosterone recently that showed low testosterone.  I provided E consult for this patient in May 2019 and requesting a laboratory test that was done.  Paul Mccarthy is still not feeling back to his normal baseline.  Paul Mccarthy is in endocrine service for an official consultation.  Paul Mccarthy is not exactly sure whether Paul Mccarthy could like to have more children.  His wife has to go through IVF process.    11/2018  After the visit, Paul Mccarthy has had the pituitary MRI performed and that was negative, I started him on Clomid 25 mg once per day and Paul Mccarthy had a repeated laboratory test in early January and the total testosterone was 525, Paul Mccarthy has reported does not feel much better and have about 70% recovery.  However Paul Mccarthy self discontinued the medication after January and is now has go down to about 40% of what Paul Mccarthy used to feel.    PAST MEDICAL HISTORY.  Patient Active Problem List:     Anal fissure     Allergic rhinitis due to pollen     Major depression, recurrent (HCC)     Social anxiety disorder     Hyperopia     Right shoulder pain     Tendinopathy of rotator cuff     Scapular dyskinesis     Serum total bilirubin elevated     Low testosterone      PAST SURGICAL HISTORY.  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  Current Outpatient Medications   Medication Sig    clomiPHENE (CLOMID) 50 MG tablet Take 0.5 tablets by mouth daily    venlafaxine  (EFFEXOR-XR) 37.5 MG 24 hr capsule Take 1 capsule by mouth daily For a week, then increase to two tablets daily    naproxen sodium (ANAPROX) 550 MG tablet Take 1 tablet by mouth 2 (two) times daily with meals Then one tablet twice daily with food as needed for 10 days    traZODone (DESYREL) 50 MG tablet Half to one tablet at night as needed    naproxen (NAPROSYN) 500 MG tablet Take 1 tablet by mouth 2 (two) times daily with meals. As needed for pain    sildenafil (VIAGRA) 50 MG tablet Take 1 tablet by mouth as needed for Erectile dysfunction.     No current facility-administered medications for this visit.        ALLERGIES.  Review of Patient's Allergies indicates:  No Known Allergies    FAMILY HISTORY.  Review of patient's family history indicates:  Problem: Diabetes      Relation: Unknown          Age of Onset: (Not Specified)          Comment: none  Problem: Heart      Relation: Mother  Age of Onset: (Not Specified)          Comment: MI age 47  Problem: Hypertension      Relation: Brother          Age of Onset: (Not Specified)  Problem: Cancer - Other      Relation: Unknown          Age of Onset: (Not Specified)          Comment: none      SOCIAL HISTORY.  Social History    Tobacco Use      Smoking status: Never Smoker      Smokeless tobacco: Never Used    Alcohol use: Yes      Alcohol/week: 4.2 standard drinks      Types: 5 Standard drinks or equivalent per week      Comment: on weekends    Drug use: No      REVIEW OF SYSTEMS.   14 points review of system was conducted. Positive as described in the above history of present illness. One other systems reviewed were negative.      __________________________________________________  PHYSICAL EXAMINATIONS:  BP 136/79  Pulse 77  Wt 106.6 kg (235 lb)  SpO2 98%  BMI 27.87 kg/m2  There is no height or weight on file to calculate BMI.  GENERAL: Not in acute distress. Well developed and well-nourished.   EYES: Examination of the eyes demonstrated full  extraocular movements. Pupils are equal, round, reactive to light and accommodation. There was no lid lag or proptosis. Negative for visual field defect in the office confrontation test.   ENT: Clear with moist mucous membranes without hyperpigmentation or lesions.   NECK: Negative for goiter. No cervical lymphadenopathy was appreciated. Negative for dorsalcervical fat deposition. Negative for acanthosis nigricans and skin tags.   CARDIOVASCULAR: Regular rate and rhythm, normal S1 and S2. Negative for murmur/rubs/gallop  CHEST: Clear to auscultation and percussion. Negative for respiratory distress/rales. Negative for chest tenderness.     GASTROINTESTINAL: Positive bowel sound, soft, nontender. Negative for hepatomegaly.    MUSCULOSKELETAL: Without clubbing, cyanosis or edema. Muscle strength was 5/5 bilateral upper and lower extremities.   SKIN: cool and dry to touch without obvious lesions. Negative for purple striae.   NEUROLOGICAL: Intact with no tremor of the outstretched fingers.  PSYCHIATRIC: Normal affect and responsiveness to questions.       LABORATORIES. -Reviewed.    Component      Latest Ref Rng & Units 04/05/2018   TESTOSTERONE TOTAL      264 - 916 ng/dL 831   SEX HORMONE BINDING GLOBULIN      16.5 - 55.9 nmol/L 23.1   LUTEINIZING HORMONE (LH)      1.2 - 10.6 mIU/mL 4.4         Pituitary MRI    FINDINGS:  BRAIN: There is no acute infarction. There is no parenchymal signal abnormality. There is no unexpected enhancement or abnormal mass effect.    VENTRICLES, CSF SPACES AND MENINGES: The ventricles are normal in size and configuration. 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     VASCULATURE: Major intracranial vessels have flow-voids indicating patency.    SINUSES AND MASTOIDS: The mastoid air cells are clear. There is mild mucosal thickening in the right maxillary sinus.    SELLA/PARASELLAR: Pituitary size is normal. There is no visualized  pituitary lesion. The pituitary stalk is midline. There is no suprasellar mass or  deviation of the optic chiasm.  Cavernous sinus structures appear normal.    IMPRESSION:  No evidence of pituitary lesion.    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  In this case Sherie Dobrowolski really should be continuing Clomid for 3 to 6 months.  I will restart his medication and have him repeat morning testosterone in about 3 months or so.  Nirvi Boehler will need to come back in 3 months or time and I intended to keep him on Clomid for at least 3 to 6 months.    PLANS:    1.  Restart on Clomid 25 mg once per day in the morning  2.  Repeat morning testosterone in about 3 months or so  3.  Counseling on medication compliance    Thank you for allowing me to particiate in the care of this interesting patient. I will continue to follow. Please let me know if you have further question I spent 15 minutes with the patient, more than 50% of the time was on providing education counseling. .   All questions answered and concerns addressed.    This encounter note was created using a voice recognition software. Please excuse any typographical error that have not yet been reviewed and corrected.    Sincerely       Yarden Hillis H Antoria Lanza, MD  ENDOCRINOLOGY, DIABETES, & METABOLISM

## 2018-08-29 ENCOUNTER — Encounter (HOSPITAL_BASED_OUTPATIENT_CLINIC_OR_DEPARTMENT_OTHER): Payer: Self-pay | Admitting: Internal Medicine

## 2018-08-30 ENCOUNTER — Encounter (HOSPITAL_BASED_OUTPATIENT_CLINIC_OR_DEPARTMENT_OTHER): Payer: Self-pay

## 2018-08-30 MED FILL — CLOMIPHENE  50MG: 89 days supply | Qty: 44 | Fill #1 | Status: CP

## 2018-09-10 ENCOUNTER — Other Ambulatory Visit: Payer: Self-pay

## 2018-09-13 ENCOUNTER — Other Ambulatory Visit: Payer: Self-pay

## 2018-09-13 ENCOUNTER — Ambulatory Visit: Payer: No Typology Code available for payment source | Attending: Internal Medicine | Admitting: Internal Medicine

## 2018-09-13 DIAGNOSIS — E291 Testicular hypofunction: Secondary | ICD-10-CM | POA: Diagnosis not present

## 2018-09-13 NOTE — Progress Notes (Signed)
This is a follow up visit regarding  No diagnosis found.  Nafeesa Dils was last seen on 05/31/2018.    HPI:    Trae Bovenzi is a 43 year old male.  His past medical history is significant for anxiety and depression.  Ladarian Bonczek does have baby girl that Makana Rostad eats 53 months of age.  Zamora Colton used testosterone in 2016 when Nanami Whitelaw was working in [employer].  Stopped that about 2 months later and started to complain about fatigue low energy, erectile dysfunction and low libido.  This is improved somewhat and Cydnie Deason demanded to be checked for testosterone recently that showed low testosterone.  I provided E consult for this patient in May 2019 and requesting a laboratory test that was done.  Temprance Wyre is still not feeling back to his normal baseline.  Kaydan Wong is in endocrine service for an official consultation.  Elena Cothern is not exactly sure whether Kenasia Scheller could like to have more children.  His wife has to go through IVF process.    11/2017  After the visit, Damyan Corne has had the pituitary MRI performed and that was negative, I started him on Clomid 25 mg once per day and Margart Zemanek had a repeated laboratory test in early January and the total testosterone was 525, Yolette Hastings has reported does not feel much better and have about 70% recovery.  However Aerabella Galasso self discontinued the medication after January and is now has go down to about 40% of what Nirav Sweda used to feel.  05/2018  Clomid was restarted      His testosterone has gone up significantly to 525 but Sujata Maines self discontinued the medication for a couple months until I communicated with him and urged the patient to restart Clomid.  Tushar Enns filled the efficacy is somewhat weaker in terms of the libido in the second time Dajour Pierpoint start to take the medication.  No other complaints.  Adel Neyer still continue to take Clomid 25 mg daily unchanged.      PAST MEDICAL HISTORY.  Patient Active Problem List:     Anal fissure     Allergic rhinitis due to pollen     Major depression, recurrent ([place])     Social anxiety disorder     Hyperopia     Right shoulder pain     Tendinopathy of  rotator cuff     Scapular dyskinesis     Serum total bilirubin elevated     Low testosterone      PAST SURGICAL HISTORY.  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  Current Outpatient Medications   Medication Sig    clomiPHENE (CLOMID) 50 MG tablet Take 0.5 tablets by mouth daily    venlafaxine (EFFEXOR-XR) 37.5 MG 24 hr capsule Take 1 capsule by mouth daily For a week, then increase to two tablets daily    naproxen sodium (ANAPROX) 550 MG tablet Take 1 tablet by mouth 2 (two) times daily with meals Then one tablet twice daily with food as needed for 10 days    traZODone (DESYREL) 50 MG tablet Half to one tablet at night as needed    naproxen (NAPROSYN) 500 MG tablet Take 1 tablet by mouth 2 (two) times daily with meals. As needed for pain    sildenafil (VIAGRA) 50 MG tablet Take 1 tablet by mouth as needed for Erectile dysfunction.     No current facility-administered medications for this visit.  ALLERGIES.  Review of Patient's Allergies indicates:  No Known Allergies    FAMILY HISTORY.  Review of patient's family history indicates:  Problem: Diabetes      Relation: Unknown          Age of Onset: (Not Specified)          Comment: none  Problem: Heart      Relation: Mother          Age of Onset: (Not Specified)          Comment: MI age 45  Problem: Hypertension      Relation: Brother          Age of Onset: (Not Specified)  Problem: Cancer - Other      Relation: Unknown          Age of Onset: (Not Specified)          Comment: none      SOCIAL HISTORY.  Social History    Tobacco Use      Smoking status: Never Smoker      Smokeless tobacco: Never Used    Alcohol use: Yes      Alcohol/week: 4.2 standard drinks      Types: 5 Standard drinks or equivalent per week      Comment: on weekends    Drug use: No      REVIEW OF SYSTEMS.   14 points review of system was conducted. Positive as described in the  above history of present illness. One other systems reviewed were negative.      __________________________________________________  PHYSICAL EXAMINATIONS:    Physical examination was not performed due to the nature of Tele-visit.    LABORATORIES. -Reviewed.    Component      Latest Ref Rng & Units 04/05/2018   TESTOSTERONE TOTAL      264 - 916 ng/dL 409525   SEX HORMONE BINDING GLOBULIN      16.5 - 55.9 nmol/L 23.1   LUTEINIZING HORMONE (LH)      1.2 - 10.6 mIU/mL 4.4         Pituitary MRI    FINDINGS:  BRAIN: There is no acute infarction. There is no parenchymal signal abnormality. There is no unexpected enhancement or abnormal mass effect.    VENTRICLES, CSF SPACES AND MENINGES: The ventricles are normal in size and configuration. 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     VASCULATURE: Major intracranial vessels have flow-voids indicating patency.    SINUSES AND MASTOIDS: The mastoid air cells are clear. There is mild mucosal thickening in the right maxillary sinus.    SELLA/PARASELLAR: Pituitary size is normal. There is no visualized pituitary lesion. The pituitary stalk is midline. There is no suprasellar mass or deviation of the optic chiasm.  Cavernous sinus structures appear normal.    IMPRESSION:  No evidence of pituitary lesion.    ASSESSMENT    43 year old male presents with hypogonadism, after prolonged discussion has decided to try Clomid and Riana Tessmer responded very well to the medication initially but unfortunately Braxton Vantrease self discontinued the medication.  In this case I restarted the medication since March 2020 and Rashun Grattan felt somewhat improved but not to the point what Emmauel Hallums used to experience.  I will suggest that Sylina Henion should have a laboratory test at this time determining where Alivea Gladson stands and we will decide on whether we should increase the dose or may be there is other issues involved.  PLANS:    1.  Continue Clomid 25 mg once per day in the morning  2.  Blood  test for morning fasting testosterone and LH, laboratory ordered.    RTC: 3 months    I spent 15 minutes with the patient.   All questions answered and concerns addressed. Due to the language barrier, the visit was conducted with the assistance of a telephone [place] interpreter.    This encounter note was created using a voice recognition software. Please excuse any typographical error that have not yet been reviewed and corrected.      Chyler Creely H Jaice Digioia, MD  ENDOCRINOLOGY, DIABETES, & METABOLISM

## 2018-09-26 ENCOUNTER — Encounter (HOSPITAL_BASED_OUTPATIENT_CLINIC_OR_DEPARTMENT_OTHER): Payer: Self-pay | Admitting: Internal Medicine

## 2018-09-27 ENCOUNTER — Other Ambulatory Visit (HOSPITAL_BASED_OUTPATIENT_CLINIC_OR_DEPARTMENT_OTHER): Payer: Self-pay | Admitting: Internal Medicine

## 2018-09-27 DIAGNOSIS — E291 Testicular hypofunction: Secondary | ICD-10-CM

## 2018-10-08 ENCOUNTER — Ambulatory Visit
Admission: RE | Admit: 2018-10-08 | Discharge: 2018-10-08 | Disposition: A | Discharge status: Home / Self Care | Payer: No Typology Code available for payment source | Attending: Internal Medicine | Admitting: Internal Medicine

## 2018-10-08 ENCOUNTER — Other Ambulatory Visit: Payer: Self-pay

## 2018-10-08 DIAGNOSIS — E291 Testicular hypofunction: Secondary | ICD-10-CM

## 2018-10-08 DIAGNOSIS — R7989 Other specified abnormal findings of blood chemistry: Secondary | ICD-10-CM | POA: Insufficient documentation

## 2018-10-08 LAB — TESTOSTERONE TOTAL: TESTOSTERONE TOTAL: 728 ng/dL (ref 264–916)

## 2018-10-08 LAB — PROSTATIC SPECIFIC ANTIGEN: PROSTATIC SPECIFIC ANTIGEN: 0.804 ng/mL (ref 0.000–4.000)

## 2018-10-08 LAB — LUTEINIZING HORMONE (LH): LUTEINIZING HORMONE (LH): 7.9 m[IU]/mL (ref 1.2–10.6)

## 2018-11-22 ENCOUNTER — Encounter (HOSPITAL_BASED_OUTPATIENT_CLINIC_OR_DEPARTMENT_OTHER): Payer: Self-pay | Admitting: Family Medicine

## 2018-11-23 NOTE — Telephone Encounter (Signed)
Called pt to schedule an appt with one of the providers for the back pain.    Unable to reach.  Left detail VM in pt's identified VM to call us back at [phone].      P.S: If pt calls back please schedule a televisit with one of the providers.

## 2018-11-29 ENCOUNTER — Ambulatory Visit: Payer: No Typology Code available for payment source | Attending: Family Medicine | Admitting: Family Medicine

## 2018-11-29 ENCOUNTER — Encounter (HOSPITAL_BASED_OUTPATIENT_CLINIC_OR_DEPARTMENT_OTHER): Payer: Self-pay | Admitting: Family Medicine

## 2018-11-29 DIAGNOSIS — F3341 Major depressive disorder, recurrent, in partial remission: Secondary | ICD-10-CM | POA: Diagnosis present

## 2018-11-29 DIAGNOSIS — M545 Low back pain: Secondary | ICD-10-CM | POA: Insufficient documentation

## 2018-11-29 MED ORDER — NAPROXEN 500 MG PO TABS
500.0000 mg | ORAL_TABLET | Freq: Two times a day (BID) | ORAL | 0 refills | Status: DC
Start: 2018-11-29 — End: 2019-09-06

## 2018-11-29 MED FILL — NAPROXEN 500MG: 30 days supply | Qty: 60 | Fill #0

## 2018-11-29 NOTE — Progress Notes (Signed)
This patient was identified as meeting criteria for a televisit rather than an in person visit due to public health concerns around COVID-19. A complete assessment and plan is detailed in the note, all of which were conducted remotely using telephone technology.  Patient identity was verbally confirmed by the patient/guardian with 2 identifiers (name, date of birth, and/or address) at the beginning of the visit  Patient/guardian verbally consented to care by televisit as appropriate.   Patient/guardian was located at their residence during the visit and confirmed that they understood they were encouraged to be in private location due to personal health information being discussed.  Patient/guardian was informed how to access face-to-face care in the event of an emergency.  Provider was located in a [facility] Ambulatory exam room during the visit.  If this is a new patient visit, all available records and medical history were reviewed by the provider.  Visit length was 20 minutes and counseling was done on the diagnoses indicated in the visit.    [phone]     S  43 year old male,  He has back pain , lower, for over a month. Tried some medicines, still botherhing him. Wonders bout mattress, it is soft. When lies down on back it bothers him, on side is fine. Bothers him when he drives, after like 5 minutes.   No trauma. No changes when it started. Mattress over 30 years old.   Goes to gym twice per week. Did not go lately due to pandeic but starting to go back. Does shoulder excercises. The pain is in the lower, middle, does not radiate. No fevers, no leg sxs, no bladder/bowel sxs.   Tried tylenol.  Has rotating/flipping mattress.  Some chronic shoulder issues.     Mood has been pretty good. Has gfriend. Is off the snri.       O:   Alert, pleasant, sounds well on the phone      Pertinent results reviewed    43 year old [place] televisited today for the following issues:    (M54.5) Low back pain, unspecified back  pain laterality, unspecified chronicity, unspecified whether sciatica present  Comment: no red flags. We will  Plan:   Get new, firm matress,  Rolled hand towel behind low back when driving  Short course nsaid  Stretches  If not improving, please call. 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 The patient/guardian expressed understanding and no barriers to adherence were identified.    The patient/guardian understands and agrees with the plan.

## 2018-12-15 ENCOUNTER — Encounter (HOSPITAL_BASED_OUTPATIENT_CLINIC_OR_DEPARTMENT_OTHER): Payer: Self-pay | Admitting: Internal Medicine

## 2018-12-16 ENCOUNTER — Encounter (HOSPITAL_BASED_OUTPATIENT_CLINIC_OR_DEPARTMENT_OTHER): Payer: Self-pay | Admitting: Internal Medicine

## 2018-12-17 MED FILL — CLOMIPHENE  50MG: 89 days supply | Qty: 44 | Fill #2

## 2019-01-04 ENCOUNTER — Ambulatory Visit: Payer: No Typology Code available for payment source | Admitting: Internal Medicine

## 2019-01-04 DIAGNOSIS — Z5321 Procedure and treatment not carried out due to patient leaving prior to being seen by health care provider: Secondary | ICD-10-CM

## 2019-01-04 NOTE — Progress Notes (Signed)
The patient left the office before the visit was finished.     I called the patient twice but was not able to reach him.  I left a message and my office number for him to call back.    Artice Holohan H Audryanna Zurita, MD

## 2019-03-08 ENCOUNTER — Ambulatory Visit: Payer: No Typology Code available for payment source | Attending: Internal Medicine | Admitting: Internal Medicine

## 2019-03-08 DIAGNOSIS — R7989 Other specified abnormal findings of blood chemistry: Secondary | ICD-10-CM | POA: Insufficient documentation

## 2019-03-08 MED ORDER — CLOMIPHENE CITRATE 50 MG PO TABS
ORAL_TABLET | ORAL | 2 refills | Status: DC
Start: 2019-03-08 — End: 2019-07-01

## 2019-03-08 MED FILL — CLOMIPHENE  50MG: 84 days supply | Qty: 18 | Fill #0

## 2019-03-08 NOTE — Progress Notes (Signed)
This is a follow up visit regarding  Low testosterone  Paul Mccarthy was last seen on 09/13/2018.    HPI:    Paul Mccarthy is a 43 year old male.  His past medical history is significant for anxiety and depression.  Paul Mccarthy does have baby girl that Paul Mccarthy eats 63 months of age.  Paul Mccarthy used testosterone in 2016 when Paul Mccarthy was working in [employer].  Stopped that about 2 months later and started to complain about fatigue low energy, erectile dysfunction and low libido.  This is improved somewhat and [name] demanded to be checked for testosterone recently that showed low testosterone.  I provided E consult for this patient in May 2019 and requesting a laboratory test that was done.  Paul Mccarthy is still not feeling back to his normal baseline.  [name] is in endocrine service for an official consultation.  Paul Mccarthy is not exactly sure whether Paul Mccarthy could like to have more children.  His wife has to go through IVF process.    11/2017  After the visit, Paul Mccarthy has had the pituitary MRI performed and that was negative, I started him on Clomid 25 mg once per day and [name] had a repeated laboratory test in early January and the total testosterone was 525, [name] has reported does not feel much better and have about 70% recovery.  However [name] self discontinued the medication after January and is now has go down to about 40% of what Paul Mccarthy used to feel.  05/2018  Clomid was restarted    08/2018    Doing well with normal testosterone with 25 mg of clomiphene daily.      Paul Mccarthy missed one appointment with me in October, has been doing fairly well and has no complaints.  Has good energy level and denies any irritation during the urination.    PAST MEDICAL HISTORY.  Patient Active Problem List:     Anal fissure     Allergic rhinitis due to pollen     Major depression, recurrent ([place])     Social anxiety disorder     Hyperopia     Right shoulder pain     Tendinopathy of rotator cuff     Scapular dyskinesis     Serum total bilirubin elevated     Low testosterone      PAST SURGICAL HISTORY.  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  Current Outpatient Medications   Medication Sig    naproxen (NAPROSYN) 500 MG tablet Take 1 tablet by mouth 2 (two) times daily with meals For one week. Then one tablet with food twice a day as needed    clomiPHENE (CLOMID) 50 MG tablet Take 0.5 tablets by mouth daily     No current facility-administered medications for this visit.        ALLERGIES.  Review of Patient's Allergies indicates:  No Known Allergies    FAMILY HISTORY.  Review of patient's family history indicates:  Problem: Diabetes      Relation: Unknown          Age of Onset: (Not Specified)          Comment: none  Problem: Heart      Relation: Mother          Age of Onset: (Not Specified)          Comment: MI age 49  Problem: Hypertension  Relation: Brother          Age of Onset: (Not Specified)  Problem: Cancer - Other      Relation: Unknown          Age of Onset: (Not Specified)          Comment: none      SOCIAL HISTORY.  Social History    Tobacco Use      Smoking status: Never Smoker      Smokeless tobacco: Never Used    Alcohol use: Yes      Alcohol/week: 4.2 standard drinks      Types: 5 Standard drinks or equivalent per week      Comment: on weekends    Drug use: No      REVIEW OF SYSTEMS.   14 points review of system was conducted. Positive as described in the above history of present illness. One other systems reviewed were negative.      __________________________________________________  PHYSICAL EXAMINATIONS:    Physical examination was not performed due to the nature of Tele-visit.    LABORATORIES. -Reviewed.    Component      Latest Ref Rng & Units 04/05/2018   TESTOSTERONE TOTAL      264 - 916 ng/dL 161   SEX HORMONE BINDING GLOBULIN      16.5 - 55.9 nmol/L 23.1   LUTEINIZING HORMONE (LH)      1.2 - 10.6 mIU/mL 4.4         Pituitary MRI    FINDINGS:  BRAIN: There is no acute infarction. There is  no parenchymal signal abnormality. There is no unexpected enhancement or abnormal mass effect.    VENTRICLES, CSF SPACES AND MENINGES: The ventricles are normal in size and configuration. 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     VASCULATURE: Major intracranial vessels have flow-voids indicating patency.    SINUSES AND MASTOIDS: The mastoid air cells are clear. There is mild mucosal thickening in the right maxillary sinus.    SELLA/PARASELLAR: Pituitary size is normal. There is no visualized pituitary lesion. The pituitary stalk is midline. There is no suprasellar mass or deviation of the optic chiasm.  Cavernous sinus structures appear normal.    IMPRESSION:  No evidence of pituitary lesion.    ASSESSMENT    43 year old male presents with hypogonadism, after prolonged discussion has decided to try Clomid and Tajha Sammarco responded very well to the medication.  Akio Hudnall was restarted on Clomid since March 2020 and has been having normal testosterone since.  [name] has no complaint and I will reduce his Clomid to 25 mg,  On Monday, Wednesday and Friday only for the next 3 months, Paul Mccarthy is going to have laboratory test before the follow-up visit and will need to decide on whether to discontinue the medication for him.Paul Mccarthy    PLANS:    1.  Reduce Clomid to 25 mg on Monday, Wednesday and Friday only  2.    Repeat laboratory test for morning total testosterone in 3 months to decide whether we need to discontinue Clomid for him.    RTC: 3 months    I spent 15 minutes with the patient.       This encounter note was created using a voice recognition software. Please excuse any typographical error that have not yet been reviewed and corrected.      Chinenye Katzenberger H Ketih Goodie, MD  ENDOCRINOLOGY, DIABETES, & METABOLISM

## 2019-04-22 ENCOUNTER — Encounter

## 2019-04-27 ENCOUNTER — Telehealth (HOSPITAL_BASED_OUTPATIENT_CLINIC_OR_DEPARTMENT_OTHER): Payer: Self-pay | Admitting: Ambulatory Care

## 2019-04-27 ENCOUNTER — Other Ambulatory Visit: Payer: Self-pay

## 2019-04-27 DIAGNOSIS — Z20822 Contact with and (suspected) exposure to covid-19: Secondary | ICD-10-CM | POA: Insufficient documentation

## 2019-04-27 NOTE — Telephone Encounter (Signed)
[place]:    I have confirmed the patient's name and DOB. Yes   Living alone  Emergency care:    1. Is the patient patient gasping for air or unable to speak? No  2. Does the patient have new severe chest pain? No  3. Is the patient lethargic or does the patient have altered mental status?  No    A) COVID symptoms (criteria for testing):    Does the patient have any of the following:    1. Fever? No  2. New or worsening cough? No  3. New or worsening shortness of breath? No  4. New myalgias (muscle aches)? No headache, no trouble vision, will take Naproxen or tylenol, aware to avoid taking 2 NSAIDS together such as Advil, motrin, aleve with naproxen  5. Anosmia (lack of smell/taste)? No  6. New sore throat? No  7. New runny nose or congestion? No  8. New vomiting or diarrhea? No, nausea  No chest pain or palpitation  No dizziness, no numbness  First day of symptoms:   today  Drinking and eating well    Aware when to call 911    C) Need for in-person evaluation:    1. Is the patient experiencing respiratory symptoms (dypsnea, orthopnea, dyspnea on exertion, wheezing), worsening respiratory symptoms from baseline, or worsening of a chronic cough? No  2. Has the patient had a fever for > 48 hours? No  3. Is the patient at [facility] ? 4 with worsening symptoms? No  4. Does the patient have chest pain (note: new severe chest pain must be referred to ED)? No  5. Is the patient dizzy or lightheaded? No  6. Does the patient have severe sore throat? No  7. Is the patient pregnant? No    D) Need for testing:    1. Does the patient have any symptom of COVID (or is the patient an asymptomatic contact)?  Yes  2. If no to (1), does the patient desire public health testing and is a public health testing slot available within the next 5 days? Yes    Disposition:    1. I have provided the patient with Home Care Advice (use [facility]). Yes   2. I have ordered testing (for symptomatic patients not requiring  in-person evaluation and asymptomatic contacts). Yes.  3. I have given the patient directions to Drive-Thru Testing (use DRIVETHRUDIRECTIONS) or [facility] (ACCDIRECTIONS), if appropriate. Yes  4. If the patient has an in-person appointment scheduled in the next 14 days (other than [facility]), I have instructed the patient that he should not attend, and have sent a message to the pertinent front desk pool:  [place]:    The following instructions are intended for patients who have been have Covid, have a Covid test pending, or who may have Covid but cannot be tested.    Treating symptoms and anticipatory guidance:     There are no specific therapies for Covid (coronavirus) in the outpatient setting.   Continue to take your prescribed medicines.   Do not take other medicines unless they have been prescribed.   Rest and take plenty of fluids.   For symptomatic relief, use acetaminophen or ibuprofen, as long as you have not been told you cannot take these medicines.   Patients with worsening symptoms should:  ? Call [phone] M-Sa 8-5 or their home clinic phone number.  ? Call 911 in an emergency.  Self-isolation and self-quarantine precautions:    The following advice applies to all patients who have been tested for Covid while their result is pending, and to all patients who test positive for Covid.     Self-isolation directions for patients:  ? You should not leave home -- no work, school, public places, stores, public transportation. No exceptions except emergencies.  ? If possible, stay in one room of the house by yourself, ideally with a window open. Use a separate bathroom if available. Household members should sleep in a separate room.  ? Duration of self-isolation: AT LEAST 10 days since first day of symptoms AND at least 24 hours since resolution of fever without antipyretics AND improvement of other symptoms. If you were hospitalized for COVID, we recommend that you stay home  for at least 20 days from your first day of symptoms (this time includes the time you were in the hospital).   Self-quarantine directions for household members and close contacts:  ? Patients should notify all close contacts that they have symptoms of Covid. Close contacts include anyone with whom the patient spent significant time (> 15 minutes in a 24-hour period, less than 6 feet apart) since becoming symptomatic. This includes co-workers in [employer], etc. Close contacts must also self-quarantine.  ? All close contacts should be tested. Even if they test negative, they must continue to quarantine. If at any time they develop symptoms, they should be retested.  ? All household members should stay home for 14 days from the patient's first date of symptoms (or first day of positive test, if asymptomatic) -- no work, school, public places, stores, public transportation. Note: If contacts are unable to isolate from the Covid patient, they must quarantine for 14 days from the last day that the patient with Covid is contagious (i.e.: quarantine for 10 days while the patient is contagious, and then 14 more days).  ? If you must end quarantine at Day 10, you may do so if you are a) asymptomatic and b) continue to monitor your symptoms for a total of 14 days. You do not need to test.  ? If you end quarantine at Day 7, you may do so only if you are a) asymptomatic, b) have a negative test at Day 5 or later), and c) continue to monitor your symptoms for a total of 14 days. A 14-day quarantine is still the safest option. [facility] cannot provide testing to end quarantine early. You can find other testing sites at [email or web address]     Additional instructions:     At home:   Prohibit all visitors unless absolutely necessary.   Wash hands frequently for at least 20 seconds with soap and water or alcohol-based sanitizer (soap and water preferred if hands visibly dirty).   Avoid touching your  face/mouth/nose/eyes and cough or sneeze into a tissue, NOT your hand.   If you must share a room with other people, wear a facemask when sharing a room with other people.   Clean all high-touch surfaces every day (counters, tabletops, doorknobs, bathroom fixtures like faucets, toilets, phones, bedside tables, lamps and lightswitches). Avoid sharing personal items.   Note: If an entire household has tested positive for Covid, it is not necessary to stay in separate rooms or wear masks.   Once it is safe for you to leave your home:   Do not attend large gatherings with people outside your household.   Wear a mask in public and stay 6 feet away  from others.    Final disposition: Patient chart routed to PCP and patient tested by [facility] criteria

## 2019-04-28 ENCOUNTER — Ambulatory Visit: Payer: No Typology Code available for payment source | Attending: Family Medicine

## 2019-04-28 DIAGNOSIS — Z20822 Contact with and (suspected) exposure to covid-19: Secondary | ICD-10-CM | POA: Insufficient documentation

## 2019-04-28 DIAGNOSIS — Z1159 Encounter for screening for other viral diseases: Secondary | ICD-10-CM

## 2019-04-28 NOTE — Progress Notes (Signed)
covid swab performed .

## 2019-04-29 ENCOUNTER — Encounter (HOSPITAL_BASED_OUTPATIENT_CLINIC_OR_DEPARTMENT_OTHER): Payer: Self-pay | Admitting: Family Medicine

## 2019-04-29 LAB — COVID-19 OUTPATIENT: COVID-19 OUTPATIENT: NEGATIVE

## 2019-06-07 ENCOUNTER — Ambulatory Visit: Payer: No Typology Code available for payment source | Attending: Internal Medicine | Admitting: Internal Medicine

## 2019-06-07 DIAGNOSIS — R7989 Other specified abnormal findings of blood chemistry: Secondary | ICD-10-CM | POA: Insufficient documentation

## 2019-06-07 NOTE — Progress Notes (Signed)
This is a follow up visit regarding  Low Mccarthy  Paul Mccarthy was last seen on 03/08/2019.    HPI:    Paul Mccarthy is a 44 year old male.  His past medical history is significant for anxiety and depression.  Paul Mccarthy does have baby girl that Paul Mccarthy eats 47 months of age.  Paul Mccarthy in 2016 when Paul Mccarthy was working in [employer]  Stopped that about 2 months later and started to complain about fatigue low energy, erectile dysfunction and low libido.  This is improved somewhat and Paul Mccarthy to be checked for Mccarthy recently that showed low Mccarthy.  I provided Paul Mccarthy for this patient in May 2019 and requesting a laboratory test that was done.  Paul Mccarthy is still not feeling back to his normal baseline.  Paul Mccarthy is in endocrine service for an official consultation.  Paul Mccarthy is not exactly sure whether Paul Mccarthy could like to have more children.  His wife has to go through IVF process.    11/2017  After the visit, Paul Mccarthy has had the pituitary MRI performed and that was negative, I started him on Clomid 25 mg once per day and Paul Mccarthy had a repeated laboratory test in early January and the total Mccarthy was 525, Paul Mccarthy has reported does not feel much better and have about 70% recovery.  However Paul Mccarthy self discontinued the medication after January and is now has go down to about 40% of what Paul Mccarthy used to feel.  05/2018  Clomid was restarted    08/2018    Doing well with normal Mccarthy with 25 mg of clomiphene daily.    03/08/2019  Clomid was reduced down to 25 mg on Monday Wednesday and Friday,     Paul Mccarthy is feeling fine no complain about the libido but still complained about fatigue which may not be related to Mccarthy level, Paul Mccarthy unfortunately has not finish the laboratory test as yet.    PAST MEDICAL HISTORY.  Patient Active Problem List:     Anal fissure     Allergic rhinitis due to pollen     Major depression, recurrent (HCC)     Social anxiety disorder     Hyperopia     Right shoulder pain     Tendinopathy of rotator cuff     Scapular  dyskinesis     Serum total bilirubin elevated     Low Mccarthy     Suspected COVID-19 virus infection      PAST SURGICAL HISTORY.  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  Current Outpatient Medications   Medication Sig    clomiPHENE (CLOMID) 50 MG tablet Take 25 mg on Monday, Wednesday, and Friday    naproxen (NAPROSYN) 500 MG tablet Take 1 tablet by mouth 2 (two) times daily with meals For one week. Then one tablet with food twice a day as needed     No current facility-administered medications for this visit.        ALLERGIES.  Review of Patient's Allergies indicates:  No Known Allergies    FAMILY HISTORY.  Review of patient's family history indicates:  Problem: Diabetes      Relation: Unknown          Age of Onset: (Not Specified)          Comment: none  Problem: Heart      Relation: Mother  Age of Onset: (Not Specified)          Comment: MI age 30  Problem: Hypertension      Relation: Brother          Age of Onset: (Not Specified)  Problem: Cancer - Other      Relation: Unknown          Age of Onset: (Not Specified)          Comment: none      SOCIAL HISTORY.  Social History    Tobacco Use      Smoking status: Never Smoker      Smokeless tobacco: Never Used    Alcohol use: Yes      Alcohol/week: 4.2 standard drinks      Types: 5 Standard drinks or equivalent per week      Comment: on weekends    Drug use: No      REVIEW OF SYSTEMS.   14 points review of system was conducted. Positive as described in the above history of present illness. One other systems reviewed were negative.      __________________________________________________  PHYSICAL EXAMINATIONS:  Physical examination was not performed due to the nature of the tele-visit, relevant laboratory data and radiology images were reviewed.      LABORATORIES. -Reviewed.    Component      Latest Ref Rng & Units 04/05/2018   Mccarthy  TOTAL      264 - 916 ng/dL 525   SEX HORMONE BINDING GLOBULIN      16.5 - 55.9 nmol/L 23.1   LUTEINIZING HORMONE (LH)      1.2 - 10.6 mIU/mL 4.4         Pituitary MRI    FINDINGS:  BRAIN: There is no acute infarction. There is no parenchymal signal abnormality. There is no unexpected enhancement or abnormal mass effect.    VENTRICLES, CSF SPACES AND MENINGES: The ventricles are normal in size and configuration. 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     VASCULATURE: Major intracranial vessels have flow-voids indicating patency.    SINUSES AND MASTOIDS: The mastoid air cells are clear. There is mild mucosal thickening in the right maxillary sinus.    SELLA/PARASELLAR: Pituitary size is normal. There is no visualized pituitary lesion. The pituitary stalk is midline. There is no suprasellar mass or deviation of the optic chiasm.  Cavernous sinus structures appear normal.    IMPRESSION:  No evidence of pituitary lesion.    ASSESSMENT    (R79.89) Low Mccarthy    Mccarthy was high while on clomid, the dose was reduced down to 25 mg on Monday Wednesday and Friday, Paul Mccarthy has not felt much difference.  I suggest that Makaila Windle needs to have a morning fasting Mccarthy measurement and if that remains to be normal then we will need to discontinue comid for him.  Plan: Mccarthy TOTAL      PLANS:    1.   Morning fasting total Mccarthy and if the level remains to be well will discontinue Clomid and repeat total Mccarthy in 3 months after the discontinuation.    RTC: 3 months    This encounter note was created using a voice recognition software. Please excuse any typographical error that have not yet been reviewed and corrected.      Paul Radovich H Geoff Dacanay, MD  ENDOCRINOLOGY, DIABETES, & METABOLISM

## 2019-06-15 ENCOUNTER — Encounter (HOSPITAL_BASED_OUTPATIENT_CLINIC_OR_DEPARTMENT_OTHER): Payer: Self-pay

## 2019-06-15 ENCOUNTER — Emergency Department
Admission: AD | Admit: 2019-06-15 | Discharge: 2019-06-15 | Disposition: A | Discharge status: Home / Self Care | Payer: No Typology Code available for payment source | Source: Intra-hospital | Attending: Emergency Medicine | Admitting: Emergency Medicine

## 2019-06-15 DIAGNOSIS — M549 Dorsalgia, unspecified: Secondary | ICD-10-CM | POA: Diagnosis not present

## 2019-06-15 DIAGNOSIS — M545 Low back pain: Secondary | ICD-10-CM

## 2019-06-15 LAB — POC URINALYSIS
BILIRUBIN, URINE: NEGATIVE
GLUCOSE,URINE: NEGATIVE
KETONE, URINE: NEGATIVE
LEUKOCYTE ESTERASE: NEGATIVE
NITRITE, URINE: NEGATIVE
OCCULT BLOOD, URINE: NEGATIVE
PH URINE: 5.5 (ref 5.0–8.0)
PROTEIN, URINE: NEGATIVE
SPECIFIC GRAVITY, URINE: 1.025 (ref 1.003–1.030)
UROBILINOGEN URINE: 0.2 (ref 0.2–1.0)

## 2019-06-15 MED ORDER — ACETAMINOPHEN 500 MG PO TABS
1000.0000 mg | ORAL_TABLET | Freq: Once | ORAL | Status: AC
Start: 2019-06-15 — End: 2019-06-15
  Administered 2019-06-15: 1000 mg via ORAL
  Filled 2019-06-15: qty 2

## 2019-06-15 MED ORDER — IBUPROFEN 600 MG PO TABS
600.00 mg | ORAL_TABLET | Freq: Three times a day (TID) | ORAL | 0 refills | Status: AC | PRN
Start: 2019-06-15 — End: 2019-06-20

## 2019-06-15 MED ORDER — DIAZEPAM 5 MG PO TABS
5.0000 mg | ORAL_TABLET | Freq: Once | ORAL | Status: AC
Start: 2019-06-15 — End: 2019-06-15
  Administered 2019-06-15: 5 mg via ORAL
  Filled 2019-06-15: qty 1

## 2019-06-15 MED ORDER — LIDOCAINE 5 % EX PTCH
1.00 | MEDICATED_PATCH | CUTANEOUS | 0 refills | Status: AC
Start: 2019-06-15 — End: 2019-06-25

## 2019-06-15 MED ORDER — CYCLOBENZAPRINE HCL 10 MG PO TABS
10.00 mg | ORAL_TABLET | Freq: Two times a day (BID) | ORAL | 0 refills | Status: AC | PRN
Start: 2019-06-15 — End: 2019-06-20

## 2019-06-15 MED ORDER — KETOROLAC TROMETHAMINE 30 MG/ML INJ
30.0000 mg | Freq: Once | Status: AC
Start: 2019-06-15 — End: 2019-06-15
  Administered 2019-06-15: 30 mg via INTRAMUSCULAR
  Filled 2019-06-15: qty 1

## 2019-06-15 MED ORDER — LIDOCAINE 4 % EX PTCH
1.0000 | MEDICATED_PATCH | Freq: Once | CUTANEOUS | Status: DC
Start: 2019-06-15 — End: 2019-06-15
  Administered 2019-06-15: 1 via TRANSDERMAL
  Filled 2019-06-15: qty 1

## 2019-06-15 NOTE — Discharge Instructions (Signed)
Please take ibuprofen as needed for pain.  Lidoderm patch as needed for pain  Heating pad 20 minutes at a time  When you take Flexeril, do not drive, consume alcohol, or go to work afterwards  Follow-up with your doctor within a week  Come back to the emergency department immediately if the pain changes in nature or location, if you have pain in your legs, if you have tingling, numbness, weakness, or in case of any new, concerning or worrisome symptoms

## 2019-06-15 NOTE — UC Triage Notes (Signed)
Pt states he rowing at the gym last night and he developed low mid back pain.  Pt took 1 Advil this morning for pain which has not helped the pain.

## 2019-06-15 NOTE — Narrator Note (Signed)
Pt states he feels better.  Pain is now 5/10.

## 2019-06-15 NOTE — Narrator Note (Signed)
Pt was reassessed by Dr. Rudene Anda.

## 2019-06-15 NOTE — Narrator Note (Signed)
Pt was medicated as ordered and is resting st this time.

## 2019-06-15 NOTE — Narrator Note (Signed)
Pt has been ambulatory in the UC  with a steady gait .  He states his pain is now 2/10.

## 2019-06-15 NOTE — Narrator Note (Signed)
Patient Disposition  Patient education for diagnosis, medications and follow-up.  Patient left UC 2:27 PM.  Patient received written instructions and prescriptions for Ibuprofen, Flexeril and the Lidoderm Patch,.    Interpreter to provide instructions: No    Patient belongings with patient: YES    Have all existing LDAs been addressed? N/A    Have all IV infusions been stopped? N/A    Destination: Home

## 2019-06-15 NOTE — UC Provider Notes (Signed)
The patient was seen primarily by me. UC nursing record was reviewed. Select prior records as available electronically through the Epic record were reviewed.     HPI:    Paul Mccarthy is a 44 year old male patient who has a past medical history of Depression and Seasonal allergies. The pt presents for evaluation of low back pain which started suddenly while the patient was rawing at the gym, constant since then, increased with movements.  Patient reports that he is rawing 2-3 times per week, unchanged recently.  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 He denies falling.  He denies tingling, numbness, weakness. No urinary incontinence. He denies fever and chills.  No nausea, emesis, abdominal pain.  No chest pain, shortness of breath and cough.  Patient is not on blood thinners.  Denies radiation of the pain to his lower extremities.  Patient took over-the-counter Advil without improvement prior to presentation.  He has a ride.    ROS: Pertinent positives were reviewed as per the HPI above. All other systems were reviewed and are negative.  Myrtis Hopping  Language of care: English  MRN: [id number]  PCP: Cecille Amsterdam, MD  Mode of arrival to ED: Walk-in.  Chief complaint: Back Pain    Past Medical History/Problem list:  Past Medical History:  No date: Depression  No date: Seasonal allergies  Patient Active Problem List:     Anal fissure     Allergic rhinitis due to pollen     Major depression, recurrent (HCC)     Social anxiety disorder     Hyperopia     Right shoulder pain     Tendinopathy of rotator cuff     Scapular dyskinesis     Serum total bilirubin elevated     Low testosterone     Suspected COVID-19 virus infection    Past Surgical History: Past Surgical History:  2008: CURTG/CAUT ANAL FISSURE W/DILAT SPHNCTR SPX SBSQ      Comment:  recurrent and persistent  Social History:   Social History     Socioeconomic History    Marital status: Single     Spouse  name: Not on file    Number of children: Not on file    Years of education: Not on file    Highest education level: Not on file   Occupational History    Not on file   Tobacco Use    Smoking status: Never Smoker    Smokeless tobacco: Never Used   Substance and Sexual Activity    Alcohol use: Yes     Alcohol/week: 4.2 standard drinks     Types: 5 Standard drinks or equivalent per week     Comment: on weekends    Drug use: No    Sexual activity: Not Currently     Partners: Female   Other Topics Concern    Not on file   Social History Narrative    From [place]. In [place] since 2001.         Is [place], not married, lives w/ brother and sister-in-law.    08/21/2014: now dating, this helps with the mood.    Works IT trainer.     And sells eucalyptus trees. 2015: the prices crashed ,very difficult for patient.        Abuse: no. But had a very tough time in [place] for about 5 years in the 90s - hard work, very busy, and mother died.  Seatb: yes    Ativities: goes to gym    Hobbies:not much , "i don't have much entertainment".   Social Determinants of Health  Financial Resource Strain:     Difficulty of Paying Living Expenses:   Food Insecurity:     Worried About Programme researcher, broadcasting/film/video in the Last Year:     Barista in the Last Year:   Transportation Needs:     Freight forwarder (Medical):     Lack of Transportation (Non-Medical):   Physical Activity:     Days of Exercise per Week:     Minutes of Exercise per Session:   Stress:     Feeling of Stress :   Social Connections:     Frequency of Communication with Friends and Family:     Frequency of Social Gatherings with Friends and Family:     Attends Religious Services:     Active Member of Clubs or Organizations:     Attends Banker Meetings:     Marital Status:   Intimate Partner Violence:     Fear of Current or Ex-Partner:     Emotionally Abused:     Physically Abused:     Sexually Abused:       Allergies: Review of Patient's Allergies indicates:  No Known Allergies    Immunizations:   Immunization History   Administered Date(s) Administered    INFLUENZA VIRUS TRI W/PRESV VACCINE 18/> YRS IM (PRIVATE) 01/16/2010    Influenza Virus Quad Presv Free Vacc 6 Mo and Older, IM 02/26/2015    Influenza Virus Quad W/Presv Vacc 6 Mo and Older, IM 03/20/2014    Td 09/02/2006    Tdap 07/20/2017          Medications:  Prior to Admission Medications   Prescriptions Last Dose Informant Patient Reported? Taking?   clomiPHENE (CLOMID) 50 MG tablet   No No   Sig: Take 25 mg on Monday, Wednesday, and Friday   naproxen (NAPROSYN) 500 MG tablet   No No   Sig: Take 1 tablet by mouth 2 (two) times daily with meals For one week. Then one tablet with food twice a day as needed      Facility-Administered Medications: None     Physical Exam (ED Bed 03/03-A):   Patient Vitals for the past 999 hrs:   BP Temp Pulse Resp SpO2 Weight   06/15/19 1152 153/88 98 F 90 16 97 % 104.3 kg (230 lb)     GENERAL:  WDWN, no acute distress, non-toxic   SKIN:  Warm & Dry, no rash, no petechiae or purpurae.  HEAD:  NCAT. Sclerae are anicteric and aninjected, oropharynx is clear with moist mucous membranes. PERRL.  NECK:  Supple, no LAN.  LUNGS:  Clear to auscultation bilaterally. No wheezes, rales, rhonchi.   HEART:  RRR.  No murmurs, rubs, or gallops.   ABDOMEN:  Soft, NTND.  No masses.  No involuntary guarding or rebound.   EXTREMITIES:  No obvious deformities. No edema. Negative Homans B.  No midline C-spine, T-spine, L-spine tenderness  Left paralumbar spinal tenderness to palpation, with no overlying erythema, edema, warmth, rash, abrasion, or laceration  NEUROLOGIC:  Alert; moves all extremities; speaking in clear fluent sentences. Normal gait without ataxia.  Sensation intact to light touch throughout. 5/5 strength globally. Patient able to stand up on tip toes and heels    Medications Given in the ED:    Medications   lidocaine  (SALONPAS)  4 % patch 1 patch (1 patch Transdermal Patch Applied 06/15/19 1301)   ketorolac (TORADOL) injection 30 mg (30 mg Intramuscular Given 06/15/19 1301)   acetaminophen (TYLENOL) tablet 1,000 mg (1,000 mg Oral Given 06/15/19 1301)   diazepam (VALIUM) tablet 5 mg (5 mg Oral Given 06/15/19 1301)    Radiology Results:  See ED COURSE   Lab Results:     Labs Reviewed   POC URINALYSIS - Abnormal; Notable for the following components:       Result Value    CLARITY SL CLOUDY (*)     All other components within normal limits        MRI Sella Turcica W & WO Contrast  CLINICAL INDICATION hypogonadotropic hypogonadism    COMPARISON: None.    TECHNIQUE: Brain MRI performed using sagittal T1, axial T1, T2, FLAIR, gradient-echo, diffusion and axial postcontrast T1-weighted sequences of the whole brain. Thin section imaging of the sella turcica performed using coronal T1 and T2, postcontrast coronal and sagittal T1 weighted sequences. Postcontrast coronal performed with 20 cc of ProHance.      FINDINGS:  BRAIN: There is no acute infarction. There is no parenchymal signal abnormality. There is no unexpected enhancement or abnormal mass effect.    VENTRICLES, CSF SPACES AND MENINGES: The ventricles are normal in size and configuration. 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     VASCULATURE: Major intracranial vessels have flow-voids indicating patency.    SINUSES AND MASTOIDS: The mastoid air cells are clear. There is mild mucosal thickening in the right maxillary sinus.    SELLA/PARASELLAR: Pituitary size is normal. There is no visualized pituitary lesion. The pituitary stalk is midline. There is no suprasellar mass or deviation of the optic chiasm.  Cavernous sinus structures appear normal.    IMPRESSION:  No evidence of pituitary lesion.        Reviewed and Electronically Signed by: Colin Mulders MD   Signed Date/Time: 01-12-2018 09:36:44             Other Results and  OLD/PRIOR records information and data (e.g. ECG, visual acuity):  See ED COURSE     ED Course and Medical Decision-making:  44 year old male presenting for sudden onset of low back pain that the patient reports started when he was rawing yesterday, constant since then, increased with movement, not improved after taking over-the-counter ibuprofen.  The pain does not radiate to lower extremities. On the exam, the patient has left paralumbar tenderness to palpation, with no overlying abrasion, laceration, ecchymosis or rash.  Differentials include mostly muscular spasm. There is no midline lumbar tenderness and no concern at this time for spinal coard pathology. He is neurologically intact, does not have urinary incontinence and denies tingling, numbness and weakness.   Due to age, the patient will require EKG due to back pain though AAA seems unlikely since pain increased with movement and clear precipitating trigger.  Kidney stone considered, unlikely but since pain is mostly on the left paralumbar area, will order UA  The patient has a ride, will order Valium, Toradol, Tylenol, Lidoderm patch    Urine analysis with no UTI and no blood.      EKG with heart rate 83, PR 156, QRS 90, QTc 413.  Sinus rhythm.  No ST elevation or depression    Patient reassessed after Lidoderm, Valium, apap and ketorolac.  Feels much improved and feels comfortable going home.  Patient seen walking with a  steady gait with no assistance, and standing up on tiptoes and heels.  Patient advised to follow-up with PCP within a week.  Will discharged on Ibuprofen, lidoderm patches and flexeril.  Strict return precautions explained to patient who feels comfortable going home and agrees with the treatment plan.  He has a ride home.  I emphasized the fact that the patient cannot go to work, operate machinery and drive after taking flexeril, and is strongly discouraged from drinking alcohol while taking Flexeril.  Advised him to apply heating pad for  20 minutes at a time.      ED Course as of Jun 15 1814   Wed Jun 15, 2019   1417 Patient reassessed and feeling much improved          Patient educated on diagnosis(es); he states understanding and agreement with plan of care.  Reasons to return to the ED were reviewed in detail. He agrees with this plan and disposition.    Disposition: Discharge    Condition on Discharge: Stable    Diagnosis/Diagnoses:  Acute left-sided low back pain without sciatica      Dorcas Carrow, MD MPH  Attending physician  Urgent care  [facility]    This Emergency Department patient encounter note was created using voice-recognition software and in real time during the ED visit.

## 2019-06-19 LAB — EKG

## 2019-06-21 ENCOUNTER — Other Ambulatory Visit: Payer: Self-pay

## 2019-06-21 ENCOUNTER — Ambulatory Visit
Admission: RE | Admit: 2019-06-21 | Discharge: 2019-06-21 | Disposition: A | Discharge status: Home / Self Care | Payer: No Typology Code available for payment source | Attending: Internal Medicine | Admitting: Internal Medicine

## 2019-06-21 DIAGNOSIS — R7989 Other specified abnormal findings of blood chemistry: Secondary | ICD-10-CM | POA: Diagnosis not present

## 2019-06-21 LAB — TESTOSTERONE TOTAL: TESTOSTERONE TOTAL: 598 ng/dL (ref 264–916)

## 2019-06-30 ENCOUNTER — Encounter (HOSPITAL_BASED_OUTPATIENT_CLINIC_OR_DEPARTMENT_OTHER): Payer: Self-pay | Admitting: Internal Medicine

## 2019-07-01 ENCOUNTER — Other Ambulatory Visit (HOSPITAL_BASED_OUTPATIENT_CLINIC_OR_DEPARTMENT_OTHER): Payer: Self-pay | Admitting: Internal Medicine

## 2019-07-01 DIAGNOSIS — E291 Testicular hypofunction: Secondary | ICD-10-CM

## 2019-08-23 ENCOUNTER — Encounter (HOSPITAL_BASED_OUTPATIENT_CLINIC_OR_DEPARTMENT_OTHER): Payer: Self-pay | Admitting: Family Medicine

## 2019-09-05 ENCOUNTER — Other Ambulatory Visit: Payer: Self-pay

## 2019-09-06 ENCOUNTER — Ambulatory Visit: Payer: No Typology Code available for payment source | Attending: Family Medicine | Admitting: Physician Assistant

## 2019-09-06 ENCOUNTER — Other Ambulatory Visit: Payer: Self-pay

## 2019-09-06 ENCOUNTER — Encounter (HOSPITAL_BASED_OUTPATIENT_CLINIC_OR_DEPARTMENT_OTHER): Payer: Self-pay | Admitting: Physician Assistant

## 2019-09-06 VITALS — BP 105/66 | HR 72 | Temp 98.1°F | Wt 229.0 lb

## 2019-09-06 DIAGNOSIS — M542 Cervicalgia: Secondary | ICD-10-CM

## 2019-09-06 MED ORDER — IBUPROFEN 800 MG PO TABS
800.00 mg | ORAL_TABLET | Freq: Three times a day (TID) | ORAL | 0 refills | Status: AC | PRN
Start: 2019-09-06 — End: 2019-10-06

## 2019-09-06 MED FILL — IBUPROFEN 800MG: 20 days supply | Qty: 60 | Fill #0

## 2019-09-06 NOTE — Progress Notes (Signed)
CC:  Paul Mccarthy is a 44 year old male who comes to the clinic for neck pain.    HPI:    07/16/19 - second dose of moderna vaccine. Then developed some soreness on the right side of the neck/scalp, posterior. Worsened when lying down to sleep. Improves with activity during the day. Never noticed any swelling or redness. Full ROM. Wonders if related to the vaccine. Initially started as stiffness and thought it would go away but it has not improved much. He does work in Holiday representative with heavy lifting but does not call injuring himself or lifting anything heavy the day before symptoms began. He does not have any associated numbness/tingling. It does seem to slowly be improving.     PMH:  Past Medical History:  No date: Depression  No date: Seasonal allergies    PSH:   Past Surgical History:  2008: CURTG/CAUT ANAL FISSURE W/DILAT SPHNCTR SPX SBSQ      Comment:  recurrent and persistent    Allergies:   NKDA     Medications:   naproxen (NAPROSYN) 500 MG tablet, Take 1 tablet by mouth 2 (two) times daily with meals For one week. Then one tablet with food twice a day as needed, Disp: 60 tablet, Rfl: 0    No current facility-administered medications on file prior to visit.        SH:   Reviewed    ROS: Pertinent positives and negatives as described in HPI.     Vital Signs:  BP 105/66 (Site: LA, Position: Sitting, Cuff Size: Lrg)    Pulse 72    Temp 98.1 F (36.7 C) (Temporal)    Wt 103.9 kg (229 lb)    SpO2 95%    BMI 27.16 kg/m     Physical Exam:  General: well-appearing adult male, no acute distress.  HEENT: Head is atraumatic. PERRLA. Oropharynx unremarkable. Nasal passages clear. TMs clear and cone of light appreciated bilaterally. Neck supple, no cervical lymphadenopathy.  CV: pulse RRR with no murmurs, clicks, or rubs. 2+ radial and dorsal pedalis pulses. No edema.   Pulm: lungs CTA bilaterally. No wheezes, rhonchi or rales  Derm: no discoloration, no rashes.  MSK: There is mild tenderness pinpoint in the right  posterior scalp at the insertion of the trapezius. Full ROM with flexion, rotation, lateral flexion. There is no lymphadenopathy. No tenderness to the spine        Assessment/Plan:    (M54.2) Neck pain  (primary encounter diagnosis)  Comment: pain in the right posterior scalp/neck for approximately 1.5 months. Began the day after his second moderna vaccine and has slowly improved since then. ?occipital lymphadenopathy v muscle strain. Exam is reassuring. Will start a course of motrin and stretching. Can f/u with [place] in 3-4 weeks if symptoms have not yet resolved. Reassessment at that time warranted and consider referral to PT if thought to be a muscle strain.   Plan: ibuprofen (ADVIL) 800 MG tablet    I spent a total of 25 minutes on this visit on the date of service (total time includes all activities performed on the date of service)            Marguarite Arbour, PA-C, 09/06/2019

## 2019-09-06 NOTE — Progress Notes (Signed)
Patient feels safe at home  Paul Mccarthy, [name], 09/06/2019

## 2019-09-08 ENCOUNTER — Ambulatory Visit: Payer: No Typology Code available for payment source | Attending: Internal Medicine | Admitting: Internal Medicine

## 2019-09-08 DIAGNOSIS — R7989 Other specified abnormal findings of blood chemistry: Secondary | ICD-10-CM | POA: Diagnosis present

## 2019-09-08 NOTE — Progress Notes (Signed)
This is a follow up visit regarding  Low testosterone  Paul Mccarthy was last seen on 06/07/2019.    HPI:    Paul Mccarthy is a 44 year old male.  His past medical history is significant for anxiety and depression.  Paul Mccarthy does have baby girl that Paul Mccarthy eats 70 months of age.  Paul Mccarthy used testosterone in 2016 when Paul Mccarthy was working in [employer]  Stopped that about 2 months later and started to complain about Paul Mccarthy, Paul Mccarthy.  This is improved somewhat and Paul Mccarthy demanded to be checked for testosterone recently that showed low testosterone.  I provided E consult for this patient in May 2019 and requesting a laboratory test that was done.  Paul Mccarthy is still not feeling back to his normal baseline.  Paul Mccarthy is in endocrine service for an official consultation.  Paul Mccarthy is not exactly sure whether Paul Mccarthy could like to have more children.  His wife has to go through IVF process.    11/2017  After the visit, Paul Mccarthy has had the pituitary MRI performed and that was negative, I started him on Clomid 25 mg once per day and Paul Mccarthy had a repeated laboratory test in early January and the total testosterone was 525, Paul Mccarthy has reported does not feel much better and have about 70% recovery.  However Paul Mccarthy self discontinued the medication after January and is now has go down to about 40% of what Paul Mccarthy used to feel.  05/2018  Clomid was restarted    08/2018    Doing well with normal testosterone with 25 mg of clomiphene daily.    03/08/2019  Clomid was reduced down to 25 mg on Monday Wednesday and Friday,    06/07/2019  D/C Clomid    Feeling the same, no complaints.     PAST MEDICAL HISTORY.  Patient Active Problem List:     Anal fissure     Allergic rhinitis due to pollen     Major depression, recurrent (HCC)     Social anxiety disorder     Hyperopia     Right shoulder pain     Tendinopathy of rotator cuff     Scapular dyskinesis     Serum total bilirubin elevated     Low testosterone     Suspected COVID-19 virus infection      PAST SURGICAL HISTORY.  Past  Surgical History:  2008: CURTG/CAUT ANAL FISSURE W/DILAT SPHNCTR SPX SBSQ      Comment:  recurrent and persistent    CURRENT PRESCRIBED MEDICATIONS.  Current Outpatient Medications   Medication Sig    ibuprofen (ADVIL) 800 MG tablet Take 1 tablet by mouth 3 (three) times daily as needed for Pain     No current facility-administered medications for this visit.       ALLERGIES.  Review of Patient's Allergies indicates:  No Known Allergies    FAMILY HISTORY.  Review of patient's family history indicates:  Problem: Diabetes      Relation: Unknown          Age of Onset: (Not Specified)          Comment: none  Problem: Heart      Relation: Mother          Age of Onset: (Not Specified)          Comment: MI age 44  Problem: Hypertension      Relation: Brother          Age of Onset: (Not  Specified)  Problem: Cancer - Other      Relation: Unknown          Age of Onset: (Not Specified)          Comment: none      SOCIAL HISTORY.  Social History    Tobacco Use      Smoking status: Never Smoker      Smokeless tobacco: Never Used    Alcohol use: Yes      Alcohol/week: 4.2 standard drinks      Types: 5 Standard drinks or equivalent per week      Comment: on weekends    Drug use: No      REVIEW OF SYSTEMS.   14 points review of system was conducted. Positive as described in the above history of present illness. One other systems reviewed were negative.      __________________________________________________  PHYSICAL EXAMINATIONS:  Physical examination was not performed due to the nature of the tele-visit, relevant laboratory data and radiology images were reviewed.      LABORATORIES. -Reviewed.    Component      Latest Ref Rng & Units 04/05/2018   TESTOSTERONE TOTAL      264 - 916 ng/dL 525   SEX HORMONE BINDING GLOBULIN      16.5 - 55.9 nmol/L 23.1   LUTEINIZING HORMONE (LH)      1.2 - 10.6 mIU/mL 4.4         Pituitary MRI    FINDINGS:  BRAIN: There is no acute infarction. There is no parenchymal signal abnormality. There is no  unexpected enhancement or abnormal mass effect.    VENTRICLES, CSF SPACES AND MENINGES: The ventricles are normal in size and configuration. 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     VASCULATURE: Major intracranial vessels have flow-voids indicating patency.    SINUSES AND MASTOIDS: The mastoid air cells are clear. There is mild mucosal thickening in the right maxillary sinus.    SELLA/PARASELLAR: Pituitary size is normal. There is no visualized pituitary lesion. The pituitary stalk is midline. There is no suprasellar mass or deviation of the optic chiasm.  Cavernous sinus structures appear normal.    IMPRESSION:  No evidence of pituitary lesion.    ASSESSMENT    (R79.89) Low testosterone    Claimant was discontinued in March this year, has not filled much difference.  Suggest that Paul Mccarthy should have a morning fasting testosterone measurement.      PLANS:      Morning fasting testosterone measurement, order placed.    RTC: 6 months    This encounter note was created using a voice recognition software. Please excuse any typographical error that have not yet been reviewed and corrected.      [name] H [name], MD  ENDOCRINOLOGY, DIABETES, & METABOLISM

## 2019-09-30 ENCOUNTER — Other Ambulatory Visit: Payer: Self-pay

## 2019-09-30 ENCOUNTER — Ambulatory Visit
Admission: RE | Admit: 2019-09-30 | Discharge: 2019-09-30 | Disposition: A | Discharge status: Home / Self Care | Payer: No Typology Code available for payment source | Attending: Internal Medicine | Admitting: Internal Medicine

## 2019-09-30 DIAGNOSIS — E291 Testicular hypofunction: Secondary | ICD-10-CM

## 2019-09-30 DIAGNOSIS — R7989 Other specified abnormal findings of blood chemistry: Secondary | ICD-10-CM | POA: Insufficient documentation

## 2019-09-30 LAB — TESTOSTERONE TOTAL: TESTOSTERONE TOTAL: 354 ng/dL (ref 264–916)

## 2019-10-07 ENCOUNTER — Encounter (HOSPITAL_BASED_OUTPATIENT_CLINIC_OR_DEPARTMENT_OTHER): Payer: Self-pay | Admitting: Internal Medicine

## 2020-01-23 ENCOUNTER — Other Ambulatory Visit: Payer: Self-pay

## 2020-01-23 ENCOUNTER — Encounter (HOSPITAL_BASED_OUTPATIENT_CLINIC_OR_DEPARTMENT_OTHER): Payer: Self-pay | Admitting: Family Medicine

## 2020-01-23 ENCOUNTER — Ambulatory Visit: Payer: No Typology Code available for payment source | Attending: Family Medicine | Admitting: Family Medicine

## 2020-01-23 VITALS — BP 136/84 | HR 67 | Temp 98.2°F | Wt 231.0 lb

## 2020-01-23 DIAGNOSIS — R7989 Other specified abnormal findings of blood chemistry: Secondary | ICD-10-CM

## 2020-01-23 DIAGNOSIS — Z23 Encounter for immunization: Secondary | ICD-10-CM | POA: Diagnosis not present

## 2020-01-23 DIAGNOSIS — R21 Rash and other nonspecific skin eruption: Secondary | ICD-10-CM | POA: Diagnosis not present

## 2020-01-23 MED ORDER — CLINDAMYCIN HCL 300 MG PO CAPS
300.0000 mg | ORAL_CAPSULE | Freq: Three times a day (TID) | ORAL | 0 refills | Status: AC
Start: 2020-01-23 — End: 2020-01-30

## 2020-01-23 MED ORDER — VALACYCLOVIR HCL 500 MG PO TABS
1000.00 mg | ORAL_TABLET | Freq: Three times a day (TID) | ORAL | 0 refills | Status: AC
Start: 2020-01-23 — End: 2020-01-30

## 2020-01-23 MED FILL — CLINDAMYCIN 300MG: 7 days supply | Qty: 21 | Fill #0 | Status: CP

## 2020-01-23 MED FILL — VALACYCLOVIR 500MG: 7 days supply | Qty: 42 | Fill #0 | Status: CP

## 2020-01-23 NOTE — Progress Notes (Signed)
SUBJECTIVE:  44 year old male  presents for 3 days ago started with a rash lower left backside of neck. A burning senstion. No fevers. It has progressed up toward the very back / low part or scalpe and also has a few (coalescing) lesiosn on his upper left back and similar on his upper left arm. Never before. Starred vesciular. No known sick contacts. Not around any one pregnant nor newborns nor immunocompromised. Had shingles as a child.   He put topoical hc on it last night ,made the burning worse.   OBJECTIVE:  general: alert, well appearing, no distress   BP 136/84 (Site: RA, Position: Sitting, Cuff Size: Lrg)    Pulse 67    Temp 98.2 F (36.8 C) (Temporal)    Wt 104.8 kg (231 lb)    SpO2 99%    BMI 27.39 kg/m    Heent: mask  Neck: Supple, no mass, no lad   Skin: confluent vesciles and confluent erythematous patch all coalesced - lower posterior left side of neck and involving up to the lower posterior scalp. The vesciles are near to wheeping. Then two separate batches of vesciles - one on uppper left back and one on upper left shoulder.   Ear and near eye not involved.     44 year old [place] seen today for the following issues:    (R21) Rash  Comment: appears most c/w shingles (herpes zoster) with a bacterial superinfection. discussed options. Is at the 72 hour mark and w/ progression of the rash. We will -   Plan: valac  clinda  discussed   Verbal parq done.   And discussed precautionary measures.   And discussed reasons to call.        (Z23) Need for prophylactic vaccination and inoculation against influenza  Comment: agrees to / would like  Plan: IMMUNIZATION ADMIN SINGLE, IIV4 VACC PRESERV         FREE AGE 43 MONTHS AND OLDER, 0.5ML, IM          Declines chol testing today, which is very reasonable.         We discussed the patients medications. The patient/guardian expressed understanding and no barriers to adherence were identified.    The patient/guardian understands and agrees with the  plan.

## 2020-01-23 NOTE — Progress Notes (Signed)
Influenza Vaccine Procedure  January 23, 2020    1. Has the patient received the information for the influenza vaccine? Yes    2. Does the patient have any of the following contraindications?  Allergy to eggs? No  Allergic reaction to previous influenza vaccines? No  Any other problems to previous influenza vaccines? No  Paralyzed by Guillain-Barre syndrome?  No  Current moderate or severe illness? No  Allergy to contact lens solution? No    3. The vaccine has been administered in the usual fashion.     Immunization information reviewed. Current VIS reviewed and given to patient/ guardian. Verbal assent obtained from patient/ guardian.  See immunization/Injection module or chart review for date of publication and additional information. Verbal assent obtained from patient/guardian. Comfort measures for possible side effects reviewed.     Raymon Mutton  LPN, [id number]

## 2020-03-12 ENCOUNTER — Ambulatory Visit: Payer: No Typology Code available for payment source | Attending: Internal Medicine | Admitting: Internal Medicine

## 2020-03-12 ENCOUNTER — Other Ambulatory Visit: Payer: Self-pay

## 2020-03-12 ENCOUNTER — Encounter (HOSPITAL_BASED_OUTPATIENT_CLINIC_OR_DEPARTMENT_OTHER): Payer: Self-pay | Admitting: Internal Medicine

## 2020-03-12 VITALS — BP 145/62 | HR 83 | Temp 97.0°F | Wt 235.0 lb

## 2020-03-12 DIAGNOSIS — R7989 Other specified abnormal findings of blood chemistry: Secondary | ICD-10-CM | POA: Insufficient documentation

## 2020-03-12 DIAGNOSIS — R030 Elevated blood-pressure reading, without diagnosis of hypertension: Secondary | ICD-10-CM | POA: Diagnosis present

## 2020-03-12 NOTE — Progress Notes (Signed)
Safe at home.-AA

## 2020-03-12 NOTE — Progress Notes (Signed)
This is a follow up visit regarding  Low testosterone  (primary encounter diagnosis)  Elevated blood pressure reading without diagnosis of hypertension  Paul Mccarthy had tele-visit with me on September 08, 2019.    HPI:    Paul Mccarthy is a 44 year old male.  His past medical history is significant for anxiety and depression.  Paul Mccarthy does have baby girl that Paul Mccarthy eats 35 months of age.  Paul Mccarthy used testosterone in 2016 when Paul Mccarthy was working in [employer]  Stopped that about 2 months later and started to complain about fatigue low energy, erectile dysfunction and low libido.  This is improved somewhat and Paul Mccarthy demanded to be checked for testosterone recently that showed low testosterone.  I provided E consult for this patient in May 2019 and requesting a laboratory test that was done.  Paul Mccarthy is still not feeling back to his normal baseline.  Paul Mccarthy is in endocrine service for an official consultation.  Paul Mccarthy is not exactly sure whether Paul Mccarthy could like to have more children.  His wife has to go through IVF process.    11/2017  After the visit, Paul Mccarthy has had the pituitary MRI performed and that was negative, I started him on Clomid 25 mg once per day and Paul Mccarthy had a repeated laboratory test in early January and the total testosterone was 525, Paul Mccarthy has reported does not feel much better and have about 70% recovery.  However Paul Mccarthy self discontinued the medication after January and is now has go down to about 40% of what Paul Mccarthy used to feel.  05/2018  Clomid was restarted    08/2018    Doing well with normal testosterone with 25 mg of clomiphene daily.    03/08/2019  Clomid was reduced down to 25 mg on Monday Wednesday and Friday,    06/07/2019  D/C Clomid    08/2019    Doing well, testosterone was within normal range, no need to re-initiate investigation.      Paul Mccarthy feels more or less the [place], has not done much physical activity, libido was unchanged and was negative for erectile dysfunction.    PAST MEDICAL HISTORY.  Patient Active Problem List:     Anal fissure     Allergic  rhinitis due to pollen     Major depression, recurrent (HCC)     Social anxiety disorder     Hyperopia     Right shoulder pain     Tendinopathy of rotator cuff     Scapular dyskinesis     Low testosterone     Suspected COVID-19 virus infection     Rash      PAST SURGICAL HISTORY.  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  No current outpatient medications on file.     No current facility-administered medications for this visit.       ALLERGIES.  Review of Patient's Allergies indicates:  No Known Allergies    FAMILY HISTORY.  Review of patient's family history indicates:  Problem: Diabetes      Relation: Unknown          Age of Onset: (Not Specified)          Comment: none  Problem: Heart      Relation: Mother          Age of Onset: (Not Specified)  Comment: MI age 44  Problem: Hypertension      Relation: Brother          Age of Onset: (Not Specified)  Problem: Cancer - Other      Relation: Unknown          Age of Onset: (Not Specified)          Comment: none      SOCIAL HISTORY.  Social History    Tobacco Use      Smoking status: Never Smoker      Smokeless tobacco: Never Used    Alcohol use: Yes      Alcohol/week: 4.2 standard drinks      Types: 5 Standard drinks or equivalent per week      Comment: on weekends    Drug use: No      REVIEW OF SYSTEMS.   14 points review of system was conducted. Positive as described in the above history of present illness. One other systems reviewed were negative.      __________________________________________________  PHYSICAL EXAMINATIONS:  BP 145/62    Pulse 83    Temp 97 F (36.1 C)    Wt 106.6 kg (235 lb)    SpO2 100%    BMI 27.87 kg/m   GENERAL: Not in acute distress. Well developed and well-nourished.   EYES: Examination of the eyes demonstrated full extraocular movements. Pupils are equal, round, reactive to light and accommodation. There was no lid lag  or proptosis. Negative for visual field defect in the office confrontation test.   ENT: Clear with moist mucous membranes without hyperpigmentation or lesions.   NECK: Negative for goiter. No cervical lymphadenopathy was appreciated. Negative for dorsalcervical fat deposition. Negative for acanthosis nigricans and skin tags.   CARDIOVASCULAR: Regular rate and rhythm, normal S1 and S2. Negative for murmur/rubs/gallop  CHEST: Clear to auscultation and percussion. Negative for respiratory distress/rales. Negative for chest tenderness.     GASTROINTESTINAL: Positive bowel sound, soft, nontender. Negative for hepatomegaly.    MUSCULOSKELETAL: Without clubbing, cyanosis or edema. Muscle strength was 5/5 bilateral upper and lower extremities.   SKIN: cool and dry to touch without obvious lesions. Negative for purple striae.   NEUROLOGICAL: Intact with no tremor of the outstretched fingers.  PSYCHIATRIC: Normal affect and responsiveness to questions.       LABORATORIES. -Reviewed.    Component      Latest Ref Rng & Units 06/21/2019 09/30/2019   TESTOSTERONE TOTAL      264 - 916 ng/dL [id number]       Pituitary MRI    FINDINGS:  BRAIN: There is no acute infarction. There is no parenchymal signal abnormality. There is no unexpected enhancement or abnormal mass effect.    VENTRICLES, CSF SPACES AND MENINGES: The ventricles are normal in size and configuration. 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     VASCULATURE: Major intracranial vessels have flow-voids indicating patency.    SINUSES AND MASTOIDS: The mastoid air cells are clear. There is mild mucosal thickening in the right maxillary sinus.    SELLA/PARASELLAR: Pituitary size is normal. There is no visualized pituitary lesion. The pituitary stalk is midline. There is no suprasellar mass or deviation of the optic chiasm.  Cavernous sinus structures appear normal.    IMPRESSION:  No evidence of pituitary  lesion.    ASSESSMENT    (R79.89) Low testosterone  (primary encounter diagnosis)    His low  testosterone was successfully treated with Clomid, it was discontinued in  March 2021 and his testosterone was within normal range back in July this year.  Roosevelt Eimers feels more or less the same,  I recommend that Nilza Eaker repeat fasting testosterone measurement again.  In addition, I encouraged patient to go back to resistance training which is very helpful in maintaining testosterone.  Plan: TESTOSTERONE TOTAL            (R03.0) Elevated blood pressure reading without diagnosis of hypertension    Lachina Salsberry is not aware any prior diagnosis of hypertension,  I reviewed his blood pressure and this appears to be an isolated elevation in systolic pressure, I do not think Libby Goehring needs any medication at this time.  I suggest that Olita Takeshita check blood pressure at home and resting state.    PLANS:    1.    Morning fasting total testosterone, ordered.  2.    Encouraged patient to check for blood pressure at home, if it is higher than 130/80 mmHg, Loella Hickle needs to contact my office.    RTC: 6 months    This encounter note was created using a voice recognition software. Please excuse any typographical error that have not yet been reviewed and corrected.      Alayshia Marini H Natassja Ollis, MD  ENDOCRINOLOGY, DIABETES, & METABOLISM

## 2020-06-18 ENCOUNTER — Encounter (HOSPITAL_BASED_OUTPATIENT_CLINIC_OR_DEPARTMENT_OTHER): Payer: Self-pay | Admitting: Family Medicine

## 2020-06-21 ENCOUNTER — Encounter (HOSPITAL_BASED_OUTPATIENT_CLINIC_OR_DEPARTMENT_OTHER): Payer: Self-pay | Admitting: Physician Assistant

## 2020-06-21 ENCOUNTER — Ambulatory Visit: Payer: No Typology Code available for payment source | Attending: Family Medicine | Admitting: Physician Assistant

## 2020-06-21 ENCOUNTER — Other Ambulatory Visit: Payer: Self-pay

## 2020-06-21 VITALS — BP 136/70 | HR 81 | Temp 97.8°F | Wt 245.0 lb

## 2020-06-21 DIAGNOSIS — J302 Other seasonal allergic rhinitis: Secondary | ICD-10-CM | POA: Insufficient documentation

## 2020-06-21 MED ORDER — FLUTICASONE PROPIONATE 50 MCG/ACT NA SUSP
1.00 | Freq: Every day | NASAL | 5 refills | Status: AC
Start: 2020-06-21 — End: 2020-12-22

## 2020-06-21 MED ORDER — FEXOFENADINE HCL 180 MG PO TABS
180.0000 mg | ORAL_TABLET | Freq: Every day | ORAL | 1 refills | Status: DC
Start: 2020-06-21 — End: 2020-09-26

## 2020-06-21 MED FILL — FLUTICASONE SPR 50MCG: 60 days supply | Qty: 16 | Fill #0

## 2020-06-21 NOTE — Progress Notes (Signed)
CC:  Paul Mccarthy is a 45 year old male who comes to the clinic for allergies.    HPI:    Seasonal allergies -  Paul Mccarthy has had seasonal allergies every since moving here from [place] years ago. Spring is terrible. Fall is not as bad. Usually from April - July he has frequent sneezing, runny nose, watery eyes and itching around the nose and eyes. Disrupts his ability to work frequently (works primarily outside in [employer]). He has trialed a number of medications in the past including zyrtec, claritin and flonase. Antihistamines have not worked too well in the past and he doesn't remember about flonase. He is requesting a referral to an allergist to have formal allergy testing and to be considered for desensitization therapy. He currently has no symptoms and is not taking any medications for allergies. He has no hx of anaphylaxis. He has no hx of food allergies. He also has no hx of eczema or asthma. No other questions or concerns today.     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  No current facility-administered medications on file prior to visit.        SH:   Reviewed    ROS: Pertinent positives and negatives as described in HPI.     Vital Signs:  BP 136/70    Pulse 81    Temp 97.8 F (36.6 C) (Temporal)    Wt 111.1 kg (245 lb)    SpO2 98%    BMI 29.05 kg/m     Physical Exam:  General: well-appearing adult male, no acute distress.  HEENT: Head is atraumatic. PERRLA. Oropharynx unremarkable. Nasal passages clear. TMs clear and cone of light appreciated bilaterally. Neck supple, no cervical lymphadenopathy.  CV: pulse RRR with no murmurs, clicks, or rubs.   Pulm: lungs CTA bilaterally. No wheezes, rhonchi or rales  Derm: No rash to exposed skin  MSK:  moving all four limbs appropriately. Grossly intact.        Assessment/Plan:    (J30.2) Seasonal allergies  (primary encounter diagnosis)  Comment: Start Allegra once daily about a week or two prior to when seasonal allergies typically begin. If breakthrough symptoms, can start flonase in conjunction. Reasonable to refer to allergy for testing and consideration for desensitization therapy.   Plan: fexofenadine (ALLEGRA) 180 MG tablet,         fluticasone (FLONASE) 50 MCG/ACT nasal spray,         REFERRAL TO ALLERGY ( INT)    I spent a total of 25 minutes on this visit on the date of service (total time includes all activities performed on the date of service)                Marguarite Arbour, PA-C, 06/21/2020

## 2020-07-05 ENCOUNTER — Other Ambulatory Visit: Payer: Self-pay

## 2020-09-03 ENCOUNTER — Ambulatory Visit (HOSPITAL_BASED_OUTPATIENT_CLINIC_OR_DEPARTMENT_OTHER): Payer: Self-pay | Admitting: Otolaryngology

## 2020-09-05 ENCOUNTER — Encounter (HOSPITAL_BASED_OUTPATIENT_CLINIC_OR_DEPARTMENT_OTHER): Payer: Self-pay | Admitting: Family Medicine

## 2020-09-05 ENCOUNTER — Ambulatory Visit: Payer: No Typology Code available for payment source | Attending: Family Medicine | Admitting: Family Medicine

## 2020-09-05 ENCOUNTER — Other Ambulatory Visit: Payer: Self-pay

## 2020-09-05 VITALS — BP 130/80 | HR 74 | Wt 237.0 lb

## 2020-09-05 DIAGNOSIS — R21 Rash and other nonspecific skin eruption: Secondary | ICD-10-CM | POA: Insufficient documentation

## 2020-09-05 MED ORDER — TRIAMCINOLONE ACETONIDE 0.5 % EX OINT
TOPICAL_OINTMENT | CUTANEOUS | 0 refills | Status: DC
Start: 2020-09-05 — End: 2020-09-20

## 2020-09-05 MED FILL — TRIAMCINOLON OIN 0.5%: 21 days supply | Qty: 30 | Fill #0

## 2020-09-05 NOTE — Progress Notes (Signed)
SUBJECTIVE:  45 year old male  presents for rash that has devloped on left leg. It is primrily on the inside aspect but also on the back side of calf too. It can be itchy. Never had before. Has had some skin discloration (slight darkening) here and there of both legs. Not sure if related. And has had spider varices for a long time. No swelling. No fevers. No insect exposure to his knoweldge. No exposure to poison ivy, etc. No new lotions, just uses a purell to both legs , but no change there.   OBJECTIVE:  general: alert, well appearing, no distress   BP 130/80    Pulse 74    Wt 107.5 kg (237 lb)    SpO2 99%    BMI 28.10 kg/m    Most Recent Weight Reading(s)  09/05/20 : 107.5 kg (237 lb)  06/21/20 : 111.1 kg (245 lb)  03/12/20 : 106.6 kg (235 lb)  01/23/20 : 104.8 kg (231 lb)  09/06/19 : 103.9 kg (229 lb)    LEs:  B/l varicose veins. No edema.   Patchy, faint, fairly well demarcated hyperpigmented macules b/l.   Left leg along medial aspect I roughtly a vertical distrubution there are eryth papules. Similar though less prominent area posteriorly.   45 year old [place] seen today for the following issues:    (R21) Rash  (primary encounter diagnosis)  Comment: ddx includes phlebitis and contact dermatitis. Does not appear to be infection. No evidence of dvit. We will  Plan: triamcinolone (KENALOG) 0.5 % ointment        If this does not improve, please contact me.   discussed        We discussed the patients medications. The patient/guardian expressed understanding and no barriers to adherence were identified.    The patient/guardian understands and agrees with the plan.

## 2020-09-11 ENCOUNTER — Encounter (HOSPITAL_BASED_OUTPATIENT_CLINIC_OR_DEPARTMENT_OTHER): Payer: Self-pay | Admitting: Family Medicine

## 2020-09-11 ENCOUNTER — Other Ambulatory Visit (HOSPITAL_BASED_OUTPATIENT_CLINIC_OR_DEPARTMENT_OTHER): Payer: Self-pay | Admitting: Family Medicine

## 2020-09-11 MED ORDER — MUPIROCIN 2 % EX OINT
TOPICAL_OINTMENT | Freq: Two times a day (BID) | CUTANEOUS | 0 refills | Status: DC
Start: 2020-09-11 — End: 2020-09-20

## 2020-09-12 MED FILL — MUPIROCIN  OIN 2%: 14 days supply | Qty: 22 | Fill #0

## 2020-09-18 ENCOUNTER — Ambulatory Visit: Payer: No Typology Code available for payment source | Attending: Family Medicine | Admitting: Family Medicine

## 2020-09-18 ENCOUNTER — Other Ambulatory Visit: Payer: Self-pay

## 2020-09-18 ENCOUNTER — Encounter (HOSPITAL_BASED_OUTPATIENT_CLINIC_OR_DEPARTMENT_OTHER): Payer: Self-pay | Admitting: Family Medicine

## 2020-09-18 VITALS — BP 130/80 | Wt 238.0 lb

## 2020-09-18 DIAGNOSIS — R21 Rash and other nonspecific skin eruption: Secondary | ICD-10-CM | POA: Diagnosis present

## 2020-09-18 NOTE — Progress Notes (Signed)
SUBJECTIVE:  45 year old male  presents for f/u .  45 year old male  presented on 09/05/2020 for rash that had devloped on left leg perhaps a week prior. It is primrily on the [anterior/medial] aspect of the left leg but then progressed toward the back side of calf too, and down [distally and medially].  It can be itchy. Never had before. Has had some skin discloration (slight darkening) here and there of both legs. Not sure if related. And has had spider varices for a long time. No swelling. No fevers. No insect exposure to his knoweldge. No exposure to poison ivy, etc. No new lotions, just uses a purell to both legs , but no change there. he works on his feet, standing, all day. He works out at [employer] twice per day.  He was rx'd trimacinalone 0.5% ointment and used bid x 5 days but it did not help. And the rash also spread to the right leg. He started bactroban bid at that time.   Presently: he thinks the bactroban may be helping a little bit, but is not too sure. The rashes are still itchy and he feels a mild generalized itch like in his arms too at this point, though, no lesions on the arms.   Pt does not shave his legs.   He takes supplement Arginine for over a year now, no change in product. About a month ago he started creatine po supplement, twice per week on average. He noticed recently that the bottle is expired.     OBJECTIVE:  general: alert, well appearing, no distress   BP 130/80    Wt 108 kg (238 lb)    BMI 28.22 kg/m    Most Recent Weight Reading(s)  09/18/20 : 108 kg (238 lb)  09/05/20 : 107.5 kg (237 lb)  06/21/20 : 111.1 kg (245 lb)  03/12/20 : 106.6 kg (235 lb)  01/23/20 : 104.8 kg (231 lb)    Heent:  mask  Neck: Supple, no mass, no lad   Skin: erythem papules b/l anterior shins , also inovolving medial and lateral aspects to some degree. Papules have coaslexced in pretibial area on the left shin. No edema. Nontender. Mild b/l varicose veins.   45 year old [place] seen today for the  following issues:    (R21) Rash  Comment: hx and exam as per above. Not improved with the triamcinalone. In fact, now has the rash on both legs. Not clear if bactroban is helping. And on exam, I do not appreicate this as bening a folliculiits. The majority of the areas where the papules are do not have follices.   Pt does not shave.   Plan: REFERRAL TO E-CONSULTATION TELEDERMATOLOGY        Will await recs.  Much apprecaited.        We discussed the patients medications. The patient/guardian expressed understanding and no barriers to adherence were identified.    The patient/guardian understands and agrees with the plan.

## 2020-09-19 ENCOUNTER — Other Ambulatory Visit (HOSPITAL_BASED_OUTPATIENT_CLINIC_OR_DEPARTMENT_OTHER): Payer: No Typology Code available for payment source | Admitting: Dermatology

## 2020-09-19 DIAGNOSIS — L299 Pruritus, unspecified: Secondary | ICD-10-CM

## 2020-09-19 DIAGNOSIS — L308 Other specified dermatitis: Secondary | ICD-10-CM

## 2020-09-19 NOTE — e-consult (Signed)
Thank you for referring your patient to me with use of the teledermatology system. I am basing my recommendations upon the information provided by the referring physician, including images, and upon review of the problem list, medications, and allergies as documented in the electronic medical record. I have not had the benefit of personally interviewing or physically examining the patient.      Consultation request:  "45 year old male presented on 09/05/2020 for rash that had devloped on left leg perhaps a week prior. It is primrily on the [anterior/medial] aspect of the left leg but then progressed toward the back side of calf too, and down [distally and medially]. It can be itchy. Never had before. Has had some skin discloration (slight darkening) here and there of both legs. Not sure if related. And has had spider varices for a long time. No swelling. No fevers. No insect exposure to his knoweldge. No exposure to poison ivy, etc. No new lotions, just uses a purell to both legs , but no change there. he works on his feet, standing, all day. He works out at [employer] twice per day.   He was rx'd trimacinalone 0.5% ointment and used bid x 5 days but it did not help. And the rash also spread to the right leg. He started bactroban bid at that time.   Presently (09/18/2020): he thinks the bactroban may be helping a little bit, but is not too sure. The rashes are still itchy and he feels a mild generalized itch like in his arms too at this point, though, no lesions on the arms.   He takes supplement Arginine for over a year now, no change in product. About a month ago he started creatine po supplement, twice per week on average. He noticed recently that the bottle is expired.   No travel outside of the country in many years.   Photos (chronologic order):   1. Left leg, anterior   2. Left leg, medial   3. Left leg, lateral   4,5. 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 Lichen amyloid is one of the 3 major forms of cutaneous amyloid (macular, lichen, nodular). In this skin disorder, a proteinaceous material, amyloid, is deposited in the skin WITHOUT involvement of other tissues/ organs. Macular and lichen amyloid have no associated risk of systemic amyloidosis. Lichen amyloid manifests as hyperpigmented papules that coalesce to form thick plaques with a rippled appearance. The lesions are persistent and intensely pruritic. Commonly affected areas include the pretibial surface (most common), the forearms, and the back. Macular amyloid is very common on the back, between the scapula, but presents as hyperpigmented patches often with rippled appearance. Lichen amyloid is extremely difficult to manage and tx strategies focus on symptomatic relief of pruritus.   Lichen simplex chronicus also in differential and can arise on the back 2/2 to neurodermatitis or notalgia paresthetica and increased scratching and rubbing of the skin. However, the appearance of the papules do not have lichenified appearance. Lichen planus also in ddx which can present as violaceous flat topped, pruritic papules. Papular mucinosis can also present as monomorphic clustered papules but these are usually more skin colored rather than hyperpigmented and typically located on the face, dorsal hands, extensor extremities. Finally, since some areas appeared to have  central clearing, tinea incognito is in the ddx as well.     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 For now, would focus on reducing pruritus, which is pt's main concern.   - d/c bactroban- there is no sign of bacterial infection  - Start betamethasone diproprionate cream BID to affected areas for up to 2 weeks then use daily x2  weeks then use no more than 3x/week. Please discuss risk of skin atrophy with long-term or inappropriate use. Avoid use on genitals, axillae, face.   - Start sarna lotion prn (up to 5 times per day) as cooling effect can be helpful at diminishing sensation of itch.  - Follow-up with patient in 4-6 weeks. If pruritus not improved with above measures, please send repeat econsult for consideration of further diagnostic/management steps and/or in person derm evaluation    Time spent on this e-Consultation :  >69min    Please let me know whether I can be of further assistance and please let me know how the patient responds to your management.    Sincerely,    Burlene Arnt, M.D.

## 2020-09-20 ENCOUNTER — Telehealth (HOSPITAL_BASED_OUTPATIENT_CLINIC_OR_DEPARTMENT_OTHER): Payer: Self-pay

## 2020-09-20 ENCOUNTER — Other Ambulatory Visit (HOSPITAL_BASED_OUTPATIENT_CLINIC_OR_DEPARTMENT_OTHER): Payer: Self-pay | Admitting: Family Medicine

## 2020-09-20 ENCOUNTER — Encounter (HOSPITAL_BASED_OUTPATIENT_CLINIC_OR_DEPARTMENT_OTHER): Payer: Self-pay | Admitting: Family Medicine

## 2020-09-20 DIAGNOSIS — E854 Organ-limited amyloidosis: Secondary | ICD-10-CM | POA: Insufficient documentation

## 2020-09-20 DIAGNOSIS — L99 Other disorders of skin and subcutaneous tissue in diseases classified elsewhere: Secondary | ICD-10-CM | POA: Insufficient documentation

## 2020-09-20 MED ORDER — BETAMETHASONE DIPROPIONATE 0.05 % EX CREA
TOPICAL_CREAM | CUTANEOUS | 1 refills | Status: AC
Start: 2020-09-20 — End: 2020-11-20

## 2020-09-20 MED ORDER — CAMPHOR-MENTHOL 0.5-0.5 % EX LOTN
TOPICAL_LOTION | CUTANEOUS | 2 refills | Status: AC
Start: 2020-09-20 — End: 2020-12-21

## 2020-09-20 MED FILL — BETAMETH DIP CRE 0.05%: 25 days supply | Qty: 45 | Fill #0 | Status: CP

## 2020-09-20 NOTE — Telephone Encounter (Signed)
-----   Message from Cecille Amsterdam, MD sent at 09/20/2020  8:47 AM EDT -----  To EC support staff:    Can you please call to book this patient for a f57f visit with me or Caryn Bee for:     Reason for visit: f/u Rash  Time frame: in four weeks from now    Thank you,  Rocky Link.

## 2020-09-20 NOTE — Telephone Encounter (Signed)
Unable to reach pt.  I left a msg on vm.  Await call back.  Lynann Beaver, [place], 09/20/2020

## 2020-09-24 ENCOUNTER — Encounter (HOSPITAL_BASED_OUTPATIENT_CLINIC_OR_DEPARTMENT_OTHER): Payer: Self-pay | Admitting: Family Medicine

## 2020-09-24 ENCOUNTER — Other Ambulatory Visit (HOSPITAL_BASED_OUTPATIENT_CLINIC_OR_DEPARTMENT_OTHER): Payer: Self-pay | Admitting: Family Medicine

## 2020-09-24 DIAGNOSIS — L99 Other disorders of skin and subcutaneous tissue in diseases classified elsewhere: Secondary | ICD-10-CM

## 2020-09-24 DIAGNOSIS — E854 Organ-limited amyloidosis: Secondary | ICD-10-CM

## 2020-09-25 ENCOUNTER — Ambulatory Visit: Payer: No Typology Code available for payment source | Attending: Family Medicine

## 2020-09-25 ENCOUNTER — Other Ambulatory Visit: Payer: Self-pay

## 2020-09-25 DIAGNOSIS — L99 Other disorders of skin and subcutaneous tissue in diseases classified elsewhere: Secondary | ICD-10-CM | POA: Diagnosis present

## 2020-09-25 DIAGNOSIS — E854 Organ-limited amyloidosis: Secondary | ICD-10-CM | POA: Insufficient documentation

## 2020-09-25 LAB — CBC, PLATELET & DIFFERENTIAL
ABSOLUTE BASO COUNT: 0.1 10*3/uL (ref 0.0–0.1)
ABSOLUTE EOSINOPHIL COUNT: 0.4 10*3/uL (ref 0.0–0.8)
ABSOLUTE IMM GRAN COUNT: 0.06 10*3/uL — ABNORMAL HIGH (ref 0.00–0.03)
ABSOLUTE LYMPH COUNT: 3 10*3/uL (ref 0.6–5.9)
ABSOLUTE MONO COUNT: 0.8 10*3/uL (ref 0.2–1.4)
ABSOLUTE NEUTROPHIL COUNT: 5.5 10*3/uL (ref 1.6–8.3)
ABSOLUTE NRBC COUNT: 0 10*3/uL (ref 0.0–0.0)
BASOPHIL %: 0.5 % (ref 0.0–1.2)
EOSINOPHIL %: 4.1 % (ref 0.0–7.0)
HEMATOCRIT: 45.3 % (ref 40.1–51.0)
HEMOGLOBIN: 15.2 g/dL (ref 13.7–17.5)
IMMATURE GRANULOCYTE %: 0.6 % — ABNORMAL HIGH (ref 0.0–0.4)
LYMPHOCYTE %: 30.3 % (ref 15.0–54.0)
MEAN CORP HGB CONC: 33.6 g/dL (ref 31.0–37.0)
MEAN CORPUSCULAR HGB: 29.3 pg (ref 26.0–34.0)
MEAN CORPUSCULAR VOL: 87.5 fl (ref 80.0–100.0)
MEAN PLATELET VOLUME: 10.6 fL (ref 8.7–12.5)
MONOCYTE %: 8.4 % (ref 4.0–13.0)
NEUTROPHIL %: 56.1 % (ref 40.0–75.0)
NRBC %: 0 % (ref 0.0–0.0)
PLATELET COUNT: 226 10*3/uL (ref 150–400)
RBC DISTRIBUTION WIDTH STD DEV: 43.3 fL (ref 35.1–46.3)
RED BLOOD CELL COUNT: 5.18 M/uL (ref 4.60–6.10)
WHITE BLOOD CELL COUNT: 9.8 10*3/uL (ref 4.0–11.0)

## 2020-09-25 LAB — RBC SEDIMENTATION RATE: RBC SEDIMENTATION RATE: 13 MM/HR (ref 0–15)

## 2020-09-25 NOTE — Progress Notes (Signed)
Released open orders.  Routine blood draw2 small sst,1 sst,2 lavender  tube.  Prepared specimens for courier.  [place], [place], 09/25/2020

## 2020-09-26 ENCOUNTER — Other Ambulatory Visit (HOSPITAL_BASED_OUTPATIENT_CLINIC_OR_DEPARTMENT_OTHER): Payer: Self-pay | Admitting: Family Medicine

## 2020-09-26 MED ORDER — CETIRIZINE HCL 10 MG PO TABS
10.00 mg | ORAL_TABLET | Freq: Every day | ORAL | 0 refills | Status: AC
Start: 2020-09-26 — End: 2020-11-26

## 2020-09-27 MED FILL — CETIRIZINE 10MG: 60 days supply | Qty: 60 | Fill #0

## 2020-09-28 ENCOUNTER — Encounter (HOSPITAL_BASED_OUTPATIENT_CLINIC_OR_DEPARTMENT_OTHER): Payer: Self-pay | Admitting: Family Medicine

## 2020-09-28 ENCOUNTER — Other Ambulatory Visit (HOSPITAL_BASED_OUTPATIENT_CLINIC_OR_DEPARTMENT_OTHER): Payer: Self-pay | Admitting: Family Medicine

## 2020-09-28 DIAGNOSIS — R7989 Other specified abnormal findings of blood chemistry: Secondary | ICD-10-CM

## 2020-09-28 LAB — COMPREHENSIVE METABOLIC PANEL
ALANINE AMINOTRANSFERASE: 29 U/L (ref 12–45)
ALBUMIN: 4.6 g/dL (ref 3.4–5.2)
ALKALINE PHOSPHATASE: 133 U/L — ABNORMAL HIGH (ref 45–117)
ANION GAP: 11 mmol/L (ref 10–22)
ASPARTATE AMINOTRANSFERASE: 26 U/L (ref 8–34)
BILIRUBIN TOTAL: 1.1 mg/dL — ABNORMAL HIGH (ref 0.2–1.0)
BUN (UREA NITROGEN): 21 mg/dL — ABNORMAL HIGH (ref 7–18)
CALCIUM: 9.7 mg/dl (ref 8.5–10.1)
CARBON DIOXIDE: 25 mmol/L (ref 21–32)
CHLORIDE: 105 mmol/L (ref 98–107)
CREATININE: 1.1 mg/dL (ref 0.7–1.2)
ESTIMATED GLOMERULAR FILT RATE: >60 mL/min (ref >60)
Glucose Random: 90 mg/dL (ref 74–160)
POTASSIUM: 4.3 mmol/L (ref 3.5–5.1)
SODIUM: 142 mmol/L (ref 136–145)
TOTAL PROTEIN: 7.1 g/dL (ref 6.4–8.2)

## 2020-09-28 LAB — PTH INTACT WITH CALCIUM
PTH INTACT CALCIUM: 9.7 mg/dL (ref 8.5–10.1)
PTH INTACT: 33 pg/mL (ref 15–65)

## 2020-09-28 LAB — STRONGYLOIDES AB IGG: STRONGYLOIDES AB IgG: 3.07 INDEX (ref 0.00–9.99)

## 2020-09-28 LAB — SCHISTOSOMAL AB IGG: SCHISTOSOMAL AB IgG: 8.2 INDEX (ref 0.00–8.99)

## 2020-09-28 LAB — THYROID SCREEN TSH REFLEX FT4: THYROID SCREEN TSH REFLEX FT4: 2.22 u[IU]/mL (ref 0.270–4.200)

## 2020-10-18 ENCOUNTER — Ambulatory Visit (HOSPITAL_BASED_OUTPATIENT_CLINIC_OR_DEPARTMENT_OTHER): Payer: Self-pay | Admitting: Physician Assistant

## 2020-10-26 ENCOUNTER — Encounter (HOSPITAL_BASED_OUTPATIENT_CLINIC_OR_DEPARTMENT_OTHER): Payer: Self-pay | Admitting: Family Medicine

## 2020-10-26 DIAGNOSIS — Z113 Encounter for screening for infections with a predominantly sexual mode of transmission: Secondary | ICD-10-CM

## 2020-10-31 ENCOUNTER — Ambulatory Visit: Payer: No Typology Code available for payment source | Attending: Family Medicine

## 2020-10-31 ENCOUNTER — Other Ambulatory Visit: Payer: Self-pay

## 2020-10-31 DIAGNOSIS — Z113 Encounter for screening for infections with a predominantly sexual mode of transmission: Secondary | ICD-10-CM | POA: Diagnosis present

## 2020-10-31 NOTE — Progress Notes (Signed)
Pt confirmed name DOB   2sst drawn  1 GC

## 2020-11-01 LAB — CHLAMYDIA GC NAAT
CHLAMYDIA TRACHOMATIS NAAT: NEGATIVE
NEISSERIA GONORRHOEAE NAAT: NEGATIVE

## 2020-11-01 LAB — HIV ANTIGEN ANTIBODY 5TH GEN: HIVAGAB QUALITATIVE: NONREACTIVE

## 2020-11-02 LAB — RPR: RPR QUAL: NONREACTIVE

## 2020-11-03 ENCOUNTER — Other Ambulatory Visit: Payer: Self-pay

## 2020-11-03 ENCOUNTER — Other Ambulatory Visit (HOSPITAL_BASED_OUTPATIENT_CLINIC_OR_DEPARTMENT_OTHER): Payer: Self-pay | Admitting: Family Medicine

## 2020-11-03 ENCOUNTER — Ambulatory Visit
Admission: RE | Admit: 2020-11-03 | Discharge: 2020-11-03 | Disposition: A | Discharge status: Home / Self Care | Payer: No Typology Code available for payment source | Attending: Family Medicine | Admitting: Family Medicine

## 2020-11-03 DIAGNOSIS — K760 Fatty (change of) liver, not elsewhere classified: Secondary | ICD-10-CM | POA: Insufficient documentation

## 2020-11-03 DIAGNOSIS — R748 Abnormal levels of other serum enzymes: Secondary | ICD-10-CM | POA: Insufficient documentation

## 2020-11-03 DIAGNOSIS — R7989 Other specified abnormal findings of blood chemistry: Secondary | ICD-10-CM

## 2020-11-04 ENCOUNTER — Encounter (HOSPITAL_BASED_OUTPATIENT_CLINIC_OR_DEPARTMENT_OTHER): Payer: Self-pay | Admitting: Family Medicine

## 2020-11-05 ENCOUNTER — Encounter (HOSPITAL_BASED_OUTPATIENT_CLINIC_OR_DEPARTMENT_OTHER): Payer: Self-pay | Admitting: Physician Assistant

## 2020-11-22 IMAGING — MR COL.CERVICAL^ROTINAS
4 of 5 series · 24 of 48 positions shown · non-contrast
Comparison: none

[Series 3: T2 · sagittal · 3.0mm · 0.49mm/px · 5 of 12 slices shown (1 of 2)]
[im 1/12]
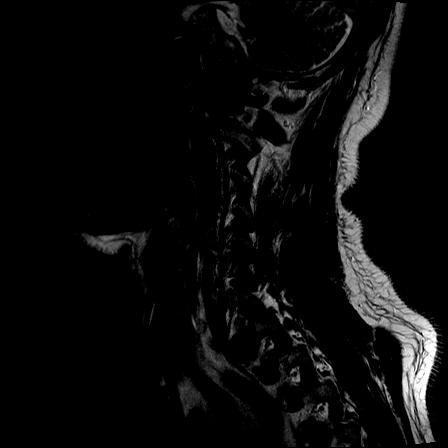
[im 3/12]
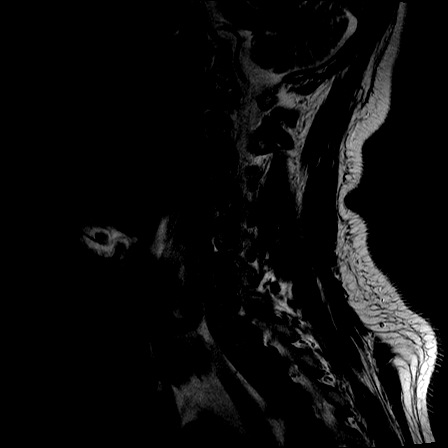
[im 6/12]
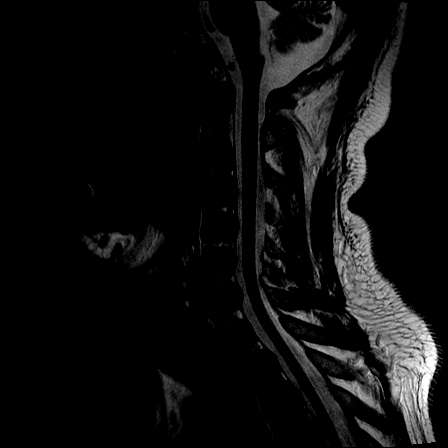
[im 9/12]
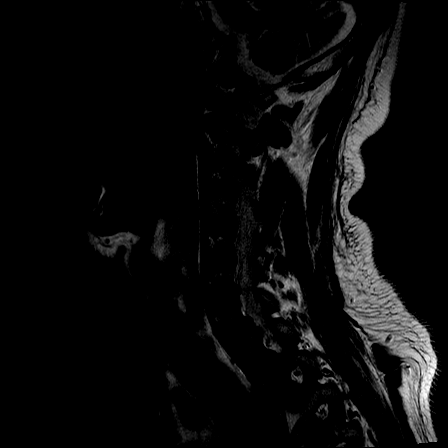
[im 12/12]
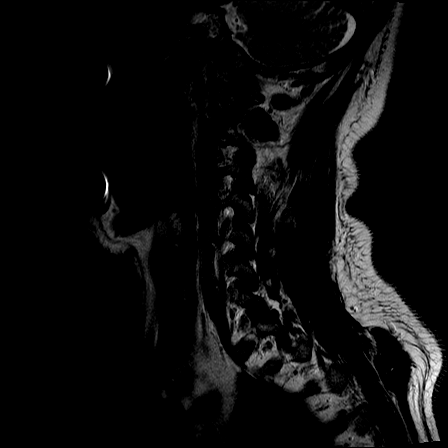

[Series 4: T1 · sagittal · 3.0mm · 0.31mm/px · 4 of 10 slices shown]
[im 1/10]
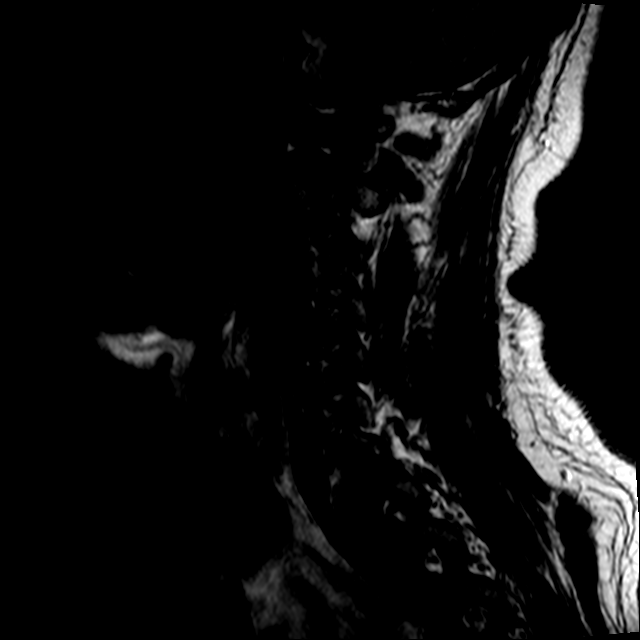
[im 4/10]
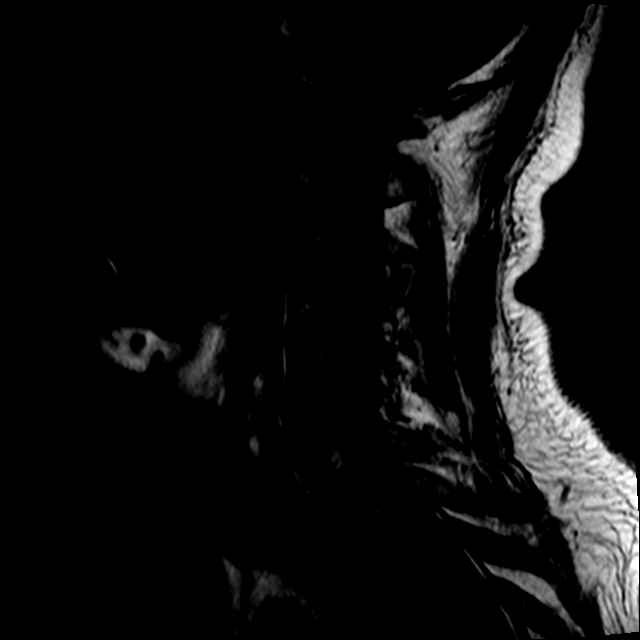
[im 7/10]
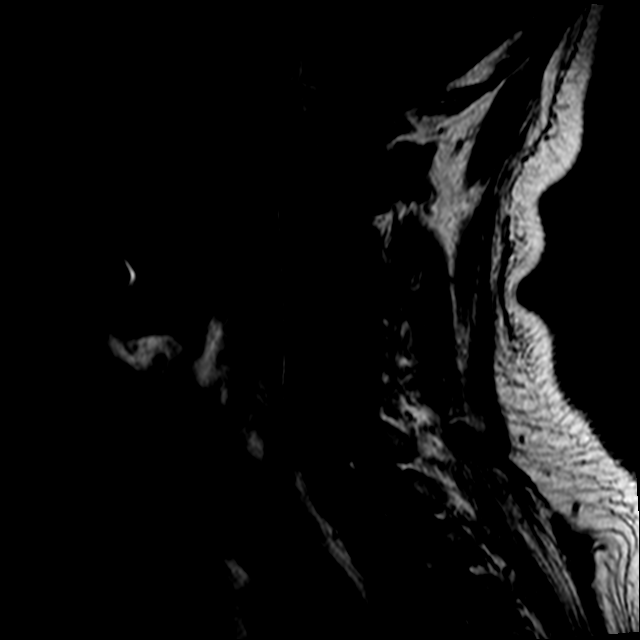
[im 10/10]
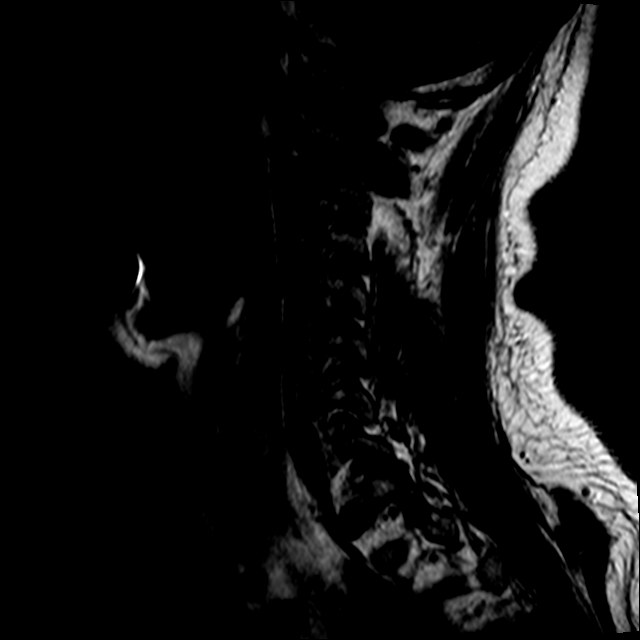

[Series 5: STIR · sagittal · 3.0mm · 0.43mm/px · 4 of 12 slices shown]
[im 1/12]
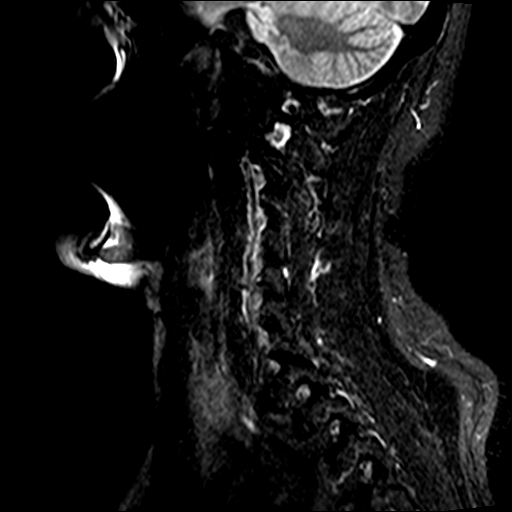
[im 3/12]
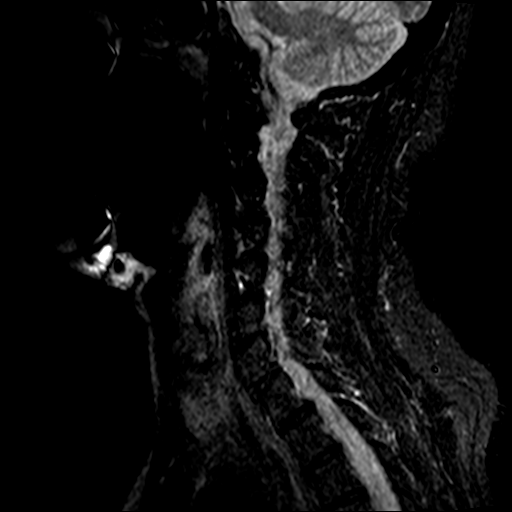
[im 6/12]
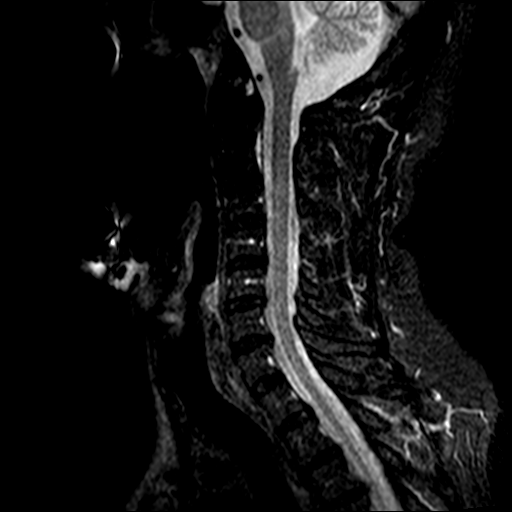
[im 12/12]
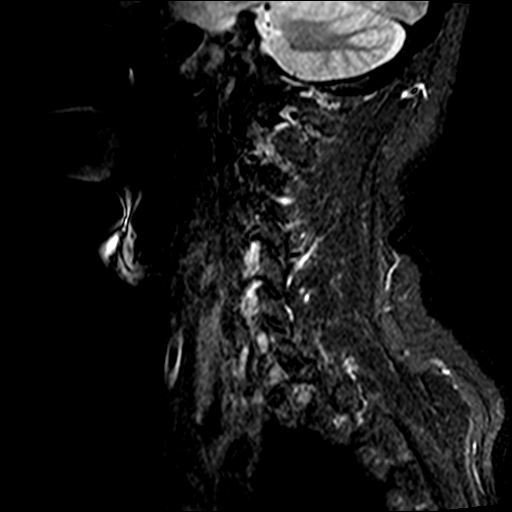

[Series 6: T2 · axial · 2.0mm · 0.50mm/px · z∈[+1,+115]mm · 11 of 64 slices shown (2 of 2)]
[im 3/64]
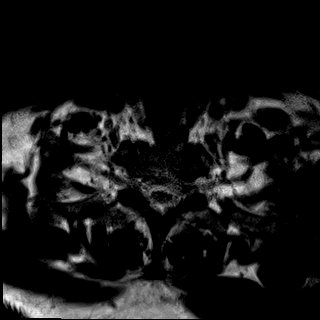
[im 10/64]
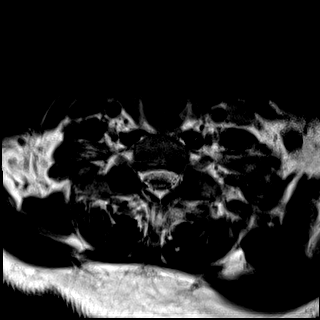
[im 12/64]
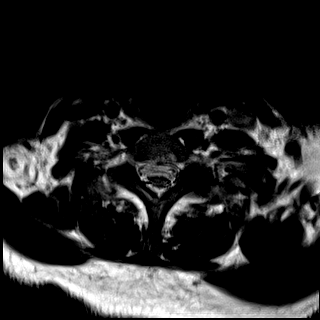
[im 19/64]
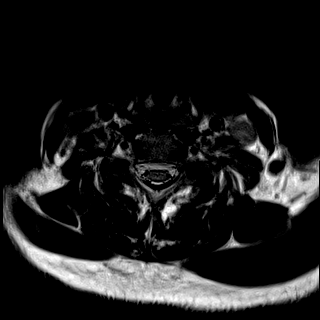
[im 29/64]
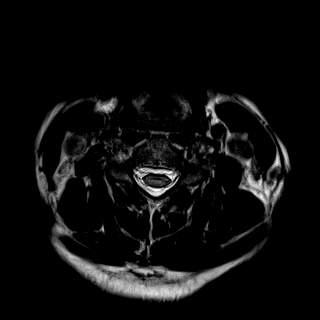
[im 33/64]
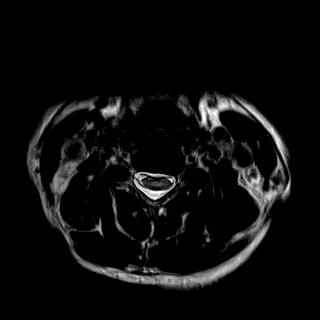
[im 36/64]
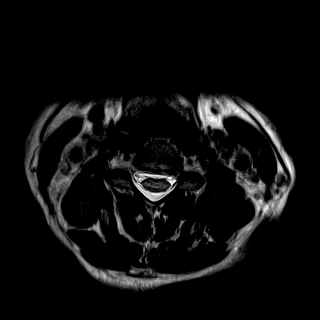
[im 45/64]
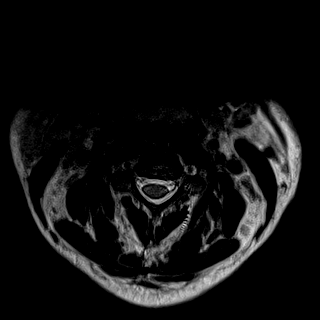
[im 52/64]
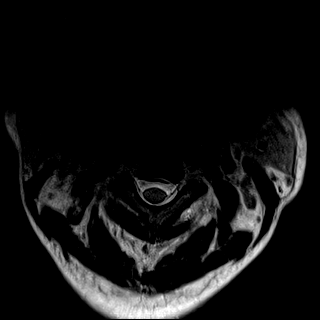
[im 54/64]
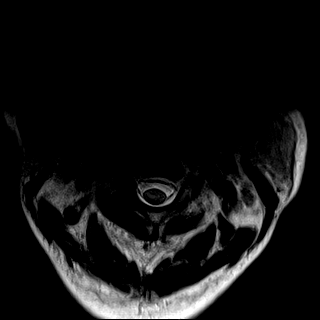
[im 61/64]
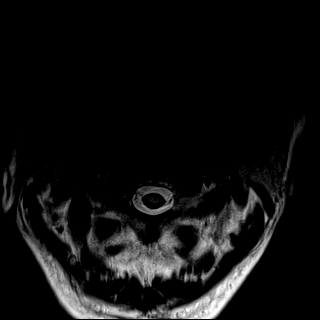

[24 of 48 positions shown; findings below may reference images not displayed]

------------- REPORT [id number] -------------
CERVICAL SPINE MAGNETIC RESONANCE IMAGING

METHODOLOGY:
The examination was performed by obtaining slices in the following planes: sagittal T1-weighted and T2-weighted; axial T2-weighted.

ANALYSIS:
Vertebral bodies aligned, with preserved height, containing small osteophytes in some segments.

Edematous change (Modic I) in the vertebral endplates apposed to C6-C7.

Posterior-foraminal disc-osteophyte complexes at C5-C6 and C6-C7.

Reduced T2 signal in the cervical discs.

Asymmetric bulging of the C6-C7 disc, which touches the ventral aspect of the dural sac and insinuates into the respective neural foramina, reducing their amplitude.

Spinal cord with usual shape, contours, caliber, and signal intensity.

Vertebral canal with preserved amplitude.

Paravertebral soft tissues with normal appearance.

DIAGNOSTIC IMPRESSION:
- Spondylosis.
- Posterior-foraminal disc-osteophyte complexes at C5-C6 and C6-C7.
- Dehydration of the cervical discs.
- Asymmetric disc bulge at C6-C7.


------------- REPORT [id number] -------------
LUMBAR SPINE MRI

METHODOLOGY:
The exam was performed obtaining slices in the following planes: sagittal T1-weighted and T2-weighted; axial and coronal T2-weighted.

ANALYSIS:
Rectification of the physiological lumbar curvature.

Vertebral bodies aligned, intact, with preserved signal intensity.

Reduced T2 signal in the L1-L2 and L4-L5 discs.

Focal postero-median disc projection of L1-L2, which impresses the ventral aspect of the dural sac.

Diffuse bulging of the L4-L5 disc, which impresses the ventral aspect of the dural sac and insinuates itself into the respective neural foramina, reducing their amplitude.

Medullary cone at the height of T12.

Vertebral canal with preserved amplitude.

Unchanged interapophyseal articular facets.

Normal appearance of paravertebral soft tissues.

DIAGNOSTIC IMPRESSION:
- Rectification of the physiological lumbar curvature.
- Disc hypohydration of L1-L2 and L4-L5.
- Focal protrusion-type disc herniation of L1-L2 (postero-median).
- Diffuse disc bulging of L4-L5.

## 2020-11-22 IMAGING — MR COL.LOMBAR^ROTINAS
4 of 7 series · 16 of 48 positions shown · non-contrast
Comparison: none

[Series 4: T1 · sagittal · 4.0mm · 0.36mm/px · 3 of 12 slices shown (1 of 2)]
[im 1/12]
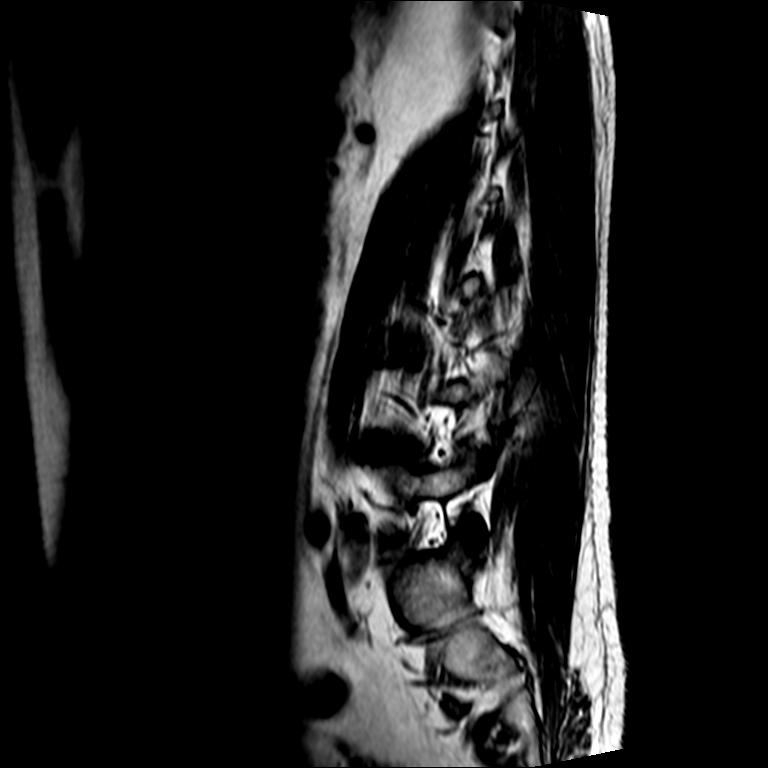
[im 6/12]
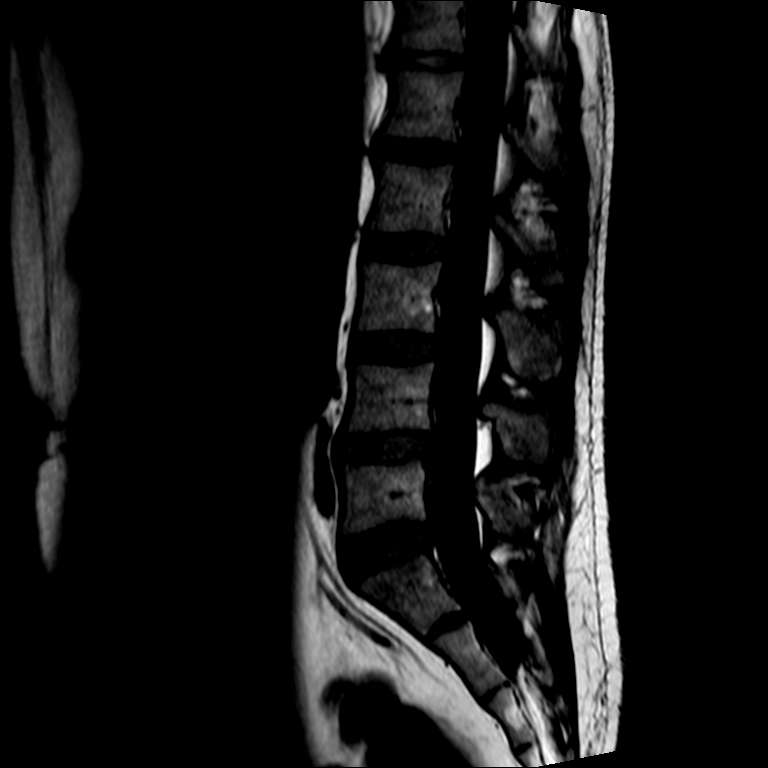
[im 12/12]
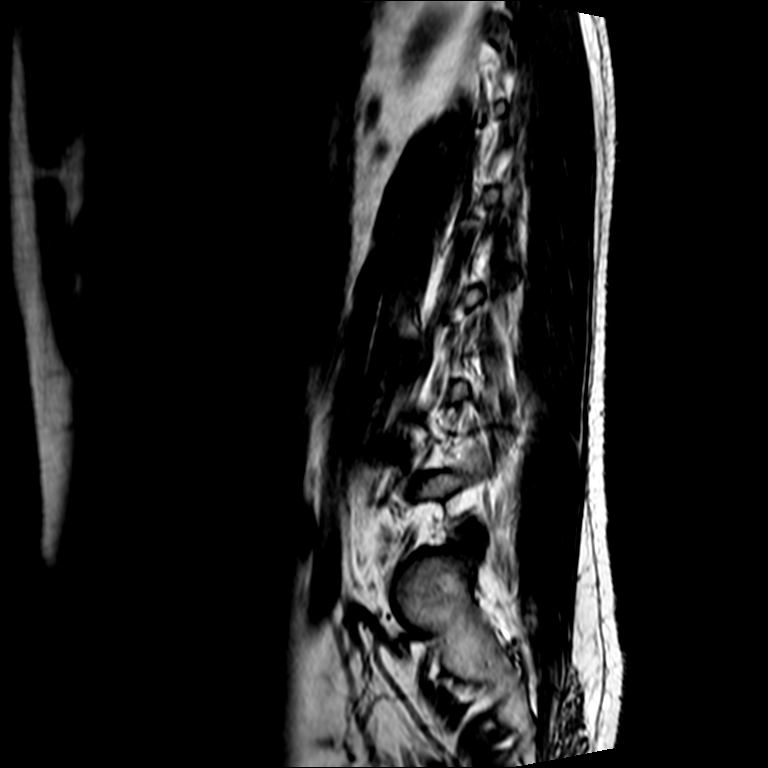

[Series 5: T2 fat-sat · sagittal · 3.5mm · 1.00mm/px · 3 of 12 slices shown]
[im 1/12]
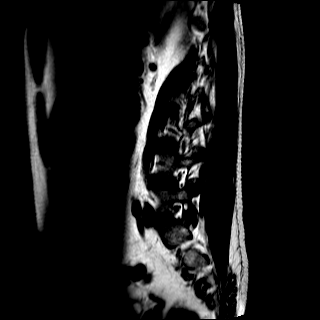
[im 6/12]
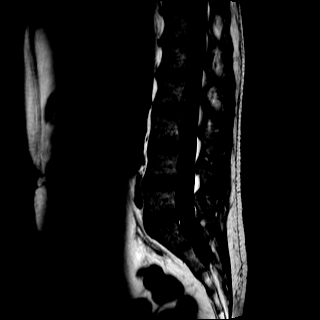
[im 12/12]
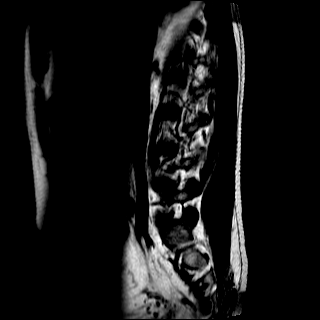

[Series 7: T2 · axial · 4.0mm · 0.31mm/px · z∈[-92,+93]mm · 7 of 25 slices shown]
[im 1/25]
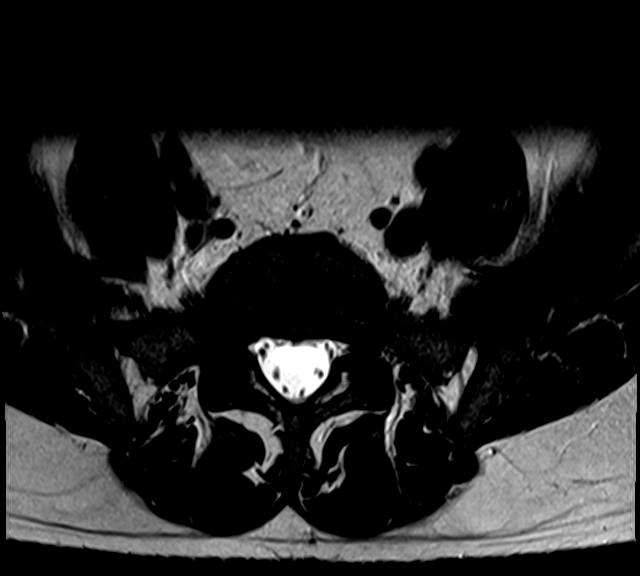
[im 3/25]
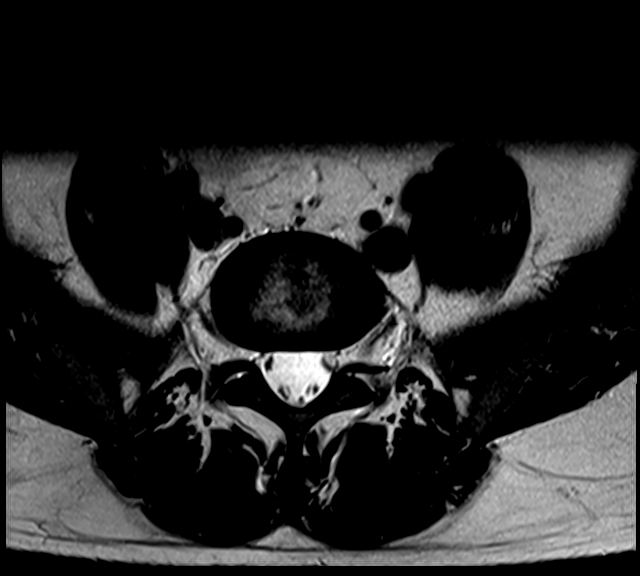
[im 9/25]
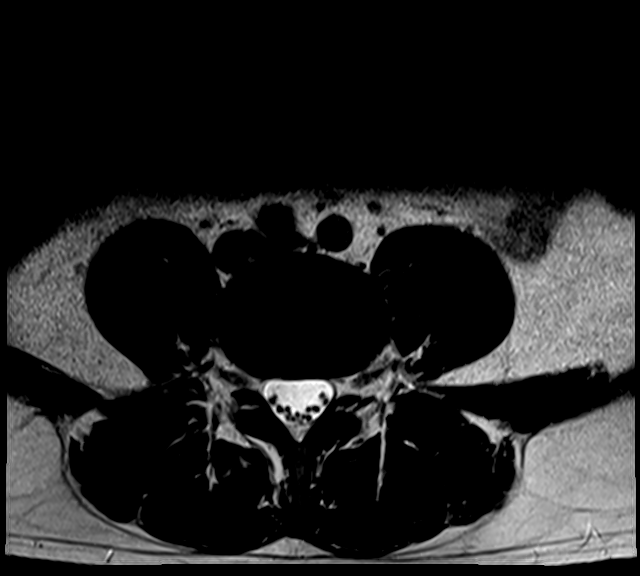
[im 11/25]
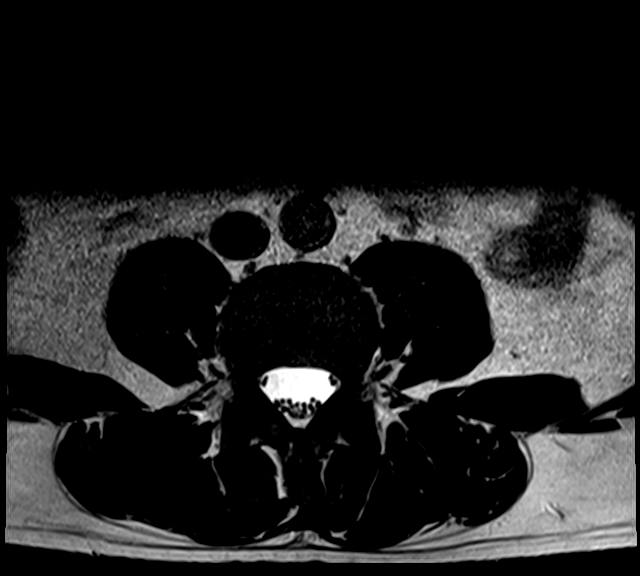
[im 14/25]
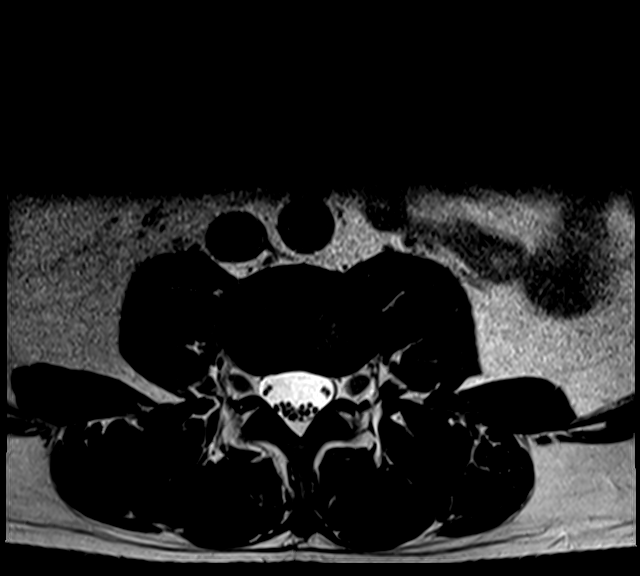
[im 17/25]
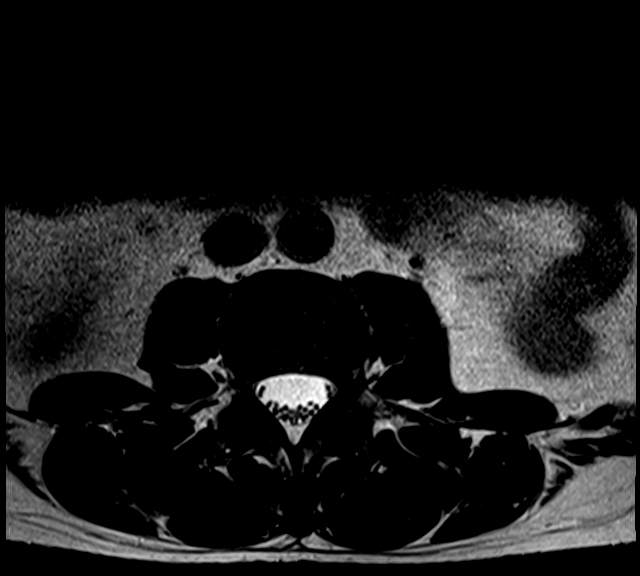
[im 22/25]
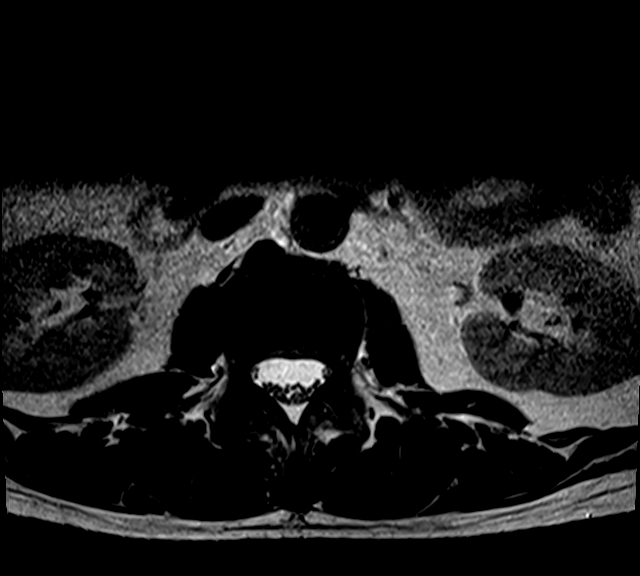

[Series 8: T1 · axial · 4.0mm · 0.90mm/px · z∈[-51,+98]mm · 3 of 34 slices shown (2 of 2)]
[im 6/34]
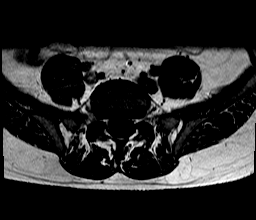
[im 18/34]
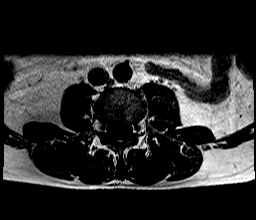
[im 28/34]
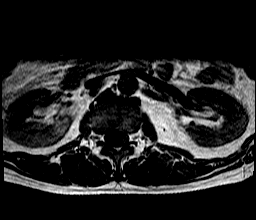

[16 of 48 positions shown; findings below may reference images not displayed]

------------- REPORT [id number] -------------
CERVICAL SPINE MAGNETIC RESONANCE IMAGING

METHODOLOGY:
The examination was performed by obtaining slices in the following planes: sagittal T1-weighted and T2-weighted; axial T2-weighted.

ANALYSIS:
Vertebral bodies aligned, with preserved height, containing small osteophytes in some segments.

Edematous change (Modic I) in the vertebral endplates apposed to C6-C7.

Posterior-foraminal disc-osteophyte complexes at C5-C6 and C6-C7.

Reduced T2 signal in the cervical discs.

Asymmetric bulging of the C6-C7 disc, which touches the ventral aspect of the dural sac and insinuates into the respective neural foramina, reducing their amplitude.

Spinal cord with usual shape, contours, caliber, and signal intensity.

Vertebral canal with preserved amplitude.

Paravertebral soft tissues with normal appearance.

DIAGNOSTIC IMPRESSION:
- Spondylosis.
- Posterior-foraminal disc-osteophyte complexes at C5-C6 and C6-C7.
- Dehydration of the cervical discs.
- Asymmetric disc bulge at C6-C7.


------------- REPORT [id number] -------------
LUMBAR SPINE MRI

METHODOLOGY:
The exam was performed obtaining slices in the following planes: sagittal T1-weighted and T2-weighted; axial and coronal T2-weighted.

ANALYSIS:
Rectification of the physiological lumbar curvature.

Vertebral bodies aligned, intact, with preserved signal intensity.

Reduced T2 signal in the L1-L2 and L4-L5 discs.

Focal postero-median disc projection of L1-L2, which impresses the ventral aspect of the dural sac.

Diffuse bulging of the L4-L5 disc, which impresses the ventral aspect of the dural sac and insinuates itself into the respective neural foramina, reducing their amplitude.

Medullary cone at the height of T12.

Vertebral canal with preserved amplitude.

Unchanged interapophyseal articular facets.

Normal appearance of paravertebral soft tissues.

DIAGNOSTIC IMPRESSION:
- Rectification of the physiological lumbar curvature.
- Disc hypohydration of L1-L2 and L4-L5.
- Focal protrusion-type disc herniation of L1-L2 (postero-median).
- Diffuse disc bulging of L4-L5.

## 2020-12-12 ENCOUNTER — Ambulatory Visit: Payer: No Typology Code available for payment source | Attending: Internal Medicine | Admitting: Physician Assistant

## 2020-12-12 ENCOUNTER — Encounter (HOSPITAL_BASED_OUTPATIENT_CLINIC_OR_DEPARTMENT_OTHER): Payer: Self-pay | Admitting: Physician Assistant

## 2020-12-12 ENCOUNTER — Other Ambulatory Visit: Payer: Self-pay

## 2020-12-12 VITALS — BP 122/74 | HR 67 | Temp 98.0°F

## 2020-12-12 DIAGNOSIS — S39012A Strain of muscle, fascia and tendon of lower back, initial encounter: Secondary | ICD-10-CM

## 2020-12-12 MED ORDER — CYCLOBENZAPRINE HCL 10 MG PO TABS
ORAL_TABLET | ORAL | 0 refills | Status: AC
Start: 2020-12-12 — End: 2021-01-11

## 2020-12-12 MED ORDER — NAPROXEN 500 MG PO TABS
500.0000 mg | ORAL_TABLET | Freq: Two times a day (BID) | ORAL | 0 refills | Status: DC
Start: 2020-12-12 — End: 2022-03-06

## 2020-12-12 MED ORDER — ACETAMINOPHEN 500 MG PO TABS
1000.00 mg | ORAL_TABLET | Freq: Two times a day (BID) | ORAL | 0 refills | Status: AC | PRN
Start: 2020-12-12 — End: 2020-12-26

## 2020-12-12 MED FILL — ACETAMINOPHEN 500MG TABS: 14 days supply | Qty: 56 | Fill #0 | Status: CP

## 2020-12-12 MED FILL — CYCLOBENZAPR 10MG: 10 days supply | Qty: 30 | Fill #0 | Status: CP

## 2020-12-12 MED FILL — NAPROXEN 500MG: 30 days supply | Qty: 60 | Fill #0 | Status: CP

## 2020-12-12 NOTE — Progress Notes (Signed)
Subjective     Paul Mccarthy is a 45 year old male presents with concern re; middle low back pain and tightness,  and bilat thighs. Started with stiffness in legs 4 days ago, then back pain started 3 days ago. Trouble sleeping last night 2/2 pain.  Better when standing, worse when lying supine.  Taking motrin 800mg  with minimal relief. No injury, fall, trauma.  Has been going to gym to lift weights more recently, works in .  Trouble bending over and lying supine.  Worse today than yesterday. No saddle anesthesia or incontinence.      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    Review of Patient's Allergies indicates:  No Known Allergies  Social History     Socioeconomic History    Marital status: Single     Spouse name: Not on file    Number of children: Not on file    Years of education: Not on file    Highest education level: Not on file   Occupational History    Not on file   Tobacco Use    Smoking status: Never Smoker    Smokeless tobacco: Never Used   Substance and Sexual Activity    Alcohol use: Yes     Alcohol/week: 4.2 standard drinks     Types: 5 Standard drinks or equivalent per week     Comment: on weekends    Drug use: No    Sexual activity: Not Currently     Partners: Female   Other Topics  Concern    Not on file   Social History Narrative    From [place]. In [place] since 2001.         Is singe, not married, lives w/ brother and sister-in-law.    08/21/2014: now dating, this helps with the mood.    Works 08/23/2014.     And sells eucalyptus trees. 2015: the prices crashed ,very difficult for patient. 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 But had a very tough time in [place] for about 5 years in the 90s - hard work, very busy, and mother died.    Seatb: yes    Ativities: goes to gym    Hobbies:not much , "i don't have much entertainment".   Social Determinants of Health  Financial Resource Strain: Not on file  Food Insecurity: Not on file  Transportation Needs: Not on file  Physical Activity: Not on file  Stress: Not on file  Social Connections: Not on file  Intimate Partner Violence: Not  on file  Housing Stability: Not on file  Past Surgical History:  2008: CURTG/CAUT ANAL FISSURE W/DILAT SPHNCTR SPX SBSQ      Comment:  recurrent and persistent  Review of patient's family history indicates:  Problem: Diabetes      Relation: Unknown          Age of Onset: (Not Specified)          Comment: none  Problem: Heart      Relation: Mother          Age of Onset: (Not Specified)          Comment: MI age 63  Problem: Hypertension      Relation: Brother          Age of Onset: (Not Specified)  Problem: Cancer - Other      Relation: Unknown          Age of Onset: (Not Specified)          Comment: none           Objective     All ROS reviewed and discussed and are otherwise negative unless listed above.       PHYSICAL EXAM:  BP 122/74 (Site: LA, Position: Sitting, Cuff Size: Reg)    Pulse 67    Temp 98 F (36.7 C) (Temporal)    SpO2 95%       GENERAL APPEARANCE - A&Ox3, WDWN, NAD  HEENT - head normocephalic, atraumatic, EOMI, PERRLA, conjunctiva clear bilaterally, no occular discharge.    Back - NTTP along spinous processes or paraspinal mm.  No CVA tenderness. +  limited ROM with flexion < hyperextension < lat flexion and bilat rotation. SLR neg bilat but posterior calf and thigh mm very tight on palpation and with movement  Extrem - no tremor or edema.   SKIN - skin color, texture, temperature, and turgor are normal in the exposed areas without rash or concerning lesions    A/P:  1. Strain of lumbar region, initial encounter  Pt ed provided and reviewed, dicussed supportive care with heat, massage, gentle stretching, biofreeze, foam roller, motrin/tylenol alternating + mm relaxer prn when severe. reviewed SE/risk/benefits and dosing. F/u prn if not improving 2 weeks, sooner if new/worsening symptoms. Given exercises to do at home. Work note provided for this week.  - naproxen (NAPROSYN) 500 MG tablet; Take 1 tablet by mouth in the morning and 1 tablet in the evening. Take with meals.  Dispense: 60 tablet; Refill: 0  - acetaminophen (TYLENOL) 500 MG tablet; Take 2 tablets by mouth 2 (two) times daily as needed for Pain  for up to 14 days  Dispense: 56 tablet; Refill: 0  - cyclobenzaprine (FLEXERIL) 10 MG tablet; Take 1/2 to 1 tab every 8 hours as needed  Dispense: 30 tablet; Refill: 0      I have reviewed the past medical, surgical, social and family history and updated these sections of EpicCare as relevant. All interim labs, test results, and consult notes were reviewed and discussed with patient. Medications were reconciled during this visit and a current medication list was given to the patient at the end of the visit.      follow up prn, as above    Janeal Holmes, PA-C

## 2021-03-27 ENCOUNTER — Encounter (HOSPITAL_BASED_OUTPATIENT_CLINIC_OR_DEPARTMENT_OTHER): Payer: Self-pay | Admitting: Family Medicine

## 2021-03-29 ENCOUNTER — Encounter (HOSPITAL_BASED_OUTPATIENT_CLINIC_OR_DEPARTMENT_OTHER): Payer: Self-pay | Admitting: Physician Assistant

## 2021-03-29 ENCOUNTER — Other Ambulatory Visit: Payer: Self-pay

## 2021-03-29 ENCOUNTER — Ambulatory Visit: Payer: No Typology Code available for payment source | Attending: Family Medicine | Admitting: Physician Assistant

## 2021-03-29 VITALS — BP 116/67 | HR 90 | Temp 97.6°F

## 2021-03-29 DIAGNOSIS — A630 Anogenital (venereal) warts: Secondary | ICD-10-CM | POA: Diagnosis present

## 2021-03-29 DIAGNOSIS — Z23 Encounter for immunization: Secondary | ICD-10-CM | POA: Diagnosis not present

## 2021-03-29 MED ORDER — IMIQUIMOD 5 % EX CREA
TOPICAL_CREAM | CUTANEOUS | 1 refills | Status: DC
Start: 2021-03-29 — End: 2023-04-17

## 2021-03-29 MED FILL — IMIQUIMOD  CRE 5%: 28 days supply | Qty: 12 | Fill #0

## 2021-03-29 NOTE — Progress Notes (Signed)
VIS given prior to administration and reviewed with the patient and or legal guardian. Patient understands the disease and the vaccine. See immunization/Injection module or chart review for date of publication and additional information.    Dairl Ponder, RN, 03/29/2021

## 2021-03-29 NOTE — Progress Notes (Signed)
CC:  Paul Mccarthy is a 45 year old male who comes to the clinic for warts.    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 Began as just 1 or 2 but now numerous. Never painful or itchy. Can be disruptive when shaving. No treatments thus far.     Would like flu vaccine today     PMH:  Past Medical History:  No date: Depression  No date: Seasonal allergies    PSH:   Past Surgical History:  2008: CURTG/CAUT ANAL FISSURE W/DILAT SPHNCTR SPX SBSQ      Comment:  recurrent and persistent    Allergies:   NKDA     Medications:   naproxen (NAPROSYN) 500 MG tablet, Take 1 tablet by mouth in the morning and 1 tablet in the evening. Take with meals., Disp: 60 tablet, Rfl: 0    No current facility-administered medications on file prior to visit.        SH:   Reviewed     ROS: Pertinent positives and negatives as described in HPI.     Vital Signs:  BP 116/67 (Site: LA, Position: Sitting, Cuff Size: Lrg)    Pulse 90    Temp 97.6 F (36.4 C) (Temporal)    SpO2 98%     Physical Exam:  General: well-appearing adult male, no acute distress.  Derm: There are multiple scattered and a few confluent genital warts on the proximal, left side of the penile shaft. Non-tender. No erythema. Skin is intact.         Assessment/Plan:    (A63.0) Genital warts  (primary encounter diagnosis)  Comment: Numerous genital warts on left side of the penile shaft. Cryotherapy considered but may be challenging and uncomfortable given the large area that is affected. Discussed and will start imiquimod cream. QHS application 3x weekly. May take 16 weeks for resolution. Will touch base via mychart at 8-12 weeks. If no improvement by that time we may want to consider cryo spray. Patient in agreement with plan.   Plan: imiquimod (ALDARA) 5 % cream    (Z23) Need for prophylactic vaccination and inoculation against influenza  Comment: Given  Plan: IMMUNIZATION ADMIN SINGLE, IIV4 VACC PRESERV         FREE AGE 23 MONTHS AND OLDER, 0.5ML, IM    I  spent a total of 25 minutes on this visit on the date of service (total time includes all activities performed on the date of service)                Marguarite Arbour, PA-C, 03/29/2021                     03/29/2021  VIS given prior to administration and reviewed with the patient and or legal guardian. Patient understands the disease and the vaccine. See immunization/Injection module or chart review for date of publication and additional information.  Marguarite Arbour, PA-C

## 2021-05-01 ENCOUNTER — Encounter (HOSPITAL_BASED_OUTPATIENT_CLINIC_OR_DEPARTMENT_OTHER): Payer: Self-pay | Admitting: Family Medicine

## 2021-05-02 ENCOUNTER — Telehealth (HOSPITAL_BASED_OUTPATIENT_CLINIC_OR_DEPARTMENT_OTHER): Payer: Self-pay | Admitting: Internal Medicine

## 2021-05-02 ENCOUNTER — Other Ambulatory Visit: Payer: Self-pay

## 2021-05-02 ENCOUNTER — Encounter (HOSPITAL_BASED_OUTPATIENT_CLINIC_OR_DEPARTMENT_OTHER): Payer: Self-pay | Admitting: Internal Medicine

## 2021-05-02 ENCOUNTER — Ambulatory Visit: Payer: No Typology Code available for payment source | Attending: Internal Medicine | Admitting: Internal Medicine

## 2021-05-02 VITALS — BP 110/71 | HR 69 | Temp 97.2°F | Resp 18 | Ht 77.0 in | Wt 231.0 lb

## 2021-05-02 DIAGNOSIS — J029 Acute pharyngitis, unspecified: Secondary | ICD-10-CM

## 2021-05-02 DIAGNOSIS — Z20822 Contact with and (suspected) exposure to covid-19: Secondary | ICD-10-CM | POA: Insufficient documentation

## 2021-05-02 LAB — RAPID STREP (POINT OF CARE): STREP SCREEN: POSITIVE — AB

## 2021-05-02 MED ORDER — AMOXICILLIN 500 MG PO TABS
500.0000 mg | ORAL_TABLET | Freq: Two times a day (BID) | ORAL | 0 refills | Status: AC
Start: 2021-05-02 — End: 2021-05-12

## 2021-05-02 MED FILL — AMOXICILLIN 500MG: 10 days supply | Qty: 20 | Fill #0 | Status: CP

## 2021-05-02 NOTE — Progress Notes (Signed)
46 y/o M clinic visit:    #Sore throat:  X 8 days  Began Tueday last week  First fever, body cahes X 1 night  Sore thorat later  No COVID test at home  No SOB  No cough  No fever currently    ROS: No overt headache, no shortness of breath, no chest pain, no abdominal pain, no difficulty walking    Physical exam  Vitals: Reviewed  Not ill appearing  General: Overtly normal affect & conversation  HEENT: Overtly normal sclera, pupils and gaze  Throat with tonsillar exudate  Abd: No overt distention   Neuro: Overtly normal gait    A&P  #Sore throat:  -Strep test, resp panel  -Counseled re prevention of spread as we await results  -Counseled regarding general self care  -Counseled patient re signs & symptoms to prompt swift re-evaluation    #F/U with me for results then PCP.

## 2021-05-02 NOTE — Telephone Encounter (Signed)
Strep +  Called and discussed w pt  Amoxicillin; counseled re use and possible side-effects  Counseled re prev of spread  Counseled re resp panel pending   -Counseled regarding general self care  -Counseled patient re signs & symptoms to prompt swift re-evaluation

## 2021-05-03 ENCOUNTER — Telehealth (HOSPITAL_BASED_OUTPATIENT_CLINIC_OR_DEPARTMENT_OTHER): Payer: Self-pay

## 2021-05-03 LAB — RESPIRATORY PANEL BASIC OUTPT
INFLUENZA A: NEGATIVE
INFLUENZA B: NEGATIVE
RESPIRATORY SYNCYTIAL VIRUS: NEGATIVE
SARS-COV-2: NEGATIVE

## 2021-05-03 NOTE — Telephone Encounter (Signed)
COVID-19 Negative Result     Called patient to discuss COVID-19 negative result.     Result: NEGATIVE.     Patient answered phone?  Yes, pt aware of result.  Letter sent.  Byrd Rushlow, [place], 05/03/2021

## 2021-11-22 MED FILL — IBUPROFEN 400MG: 5 days supply | Qty: 20 | Fill #0

## 2021-11-22 MED FILL — PENICILLN VK 500MG: 7 days supply | Qty: 28 | Fill #0

## 2022-01-30 ENCOUNTER — Other Ambulatory Visit: Payer: Self-pay

## 2022-01-30 ENCOUNTER — Ambulatory Visit (HOSPITAL_BASED_OUTPATIENT_CLINIC_OR_DEPARTMENT_OTHER): Payer: Self-pay | Admitting: Registered Nurse

## 2022-01-30 NOTE — Telephone Encounter (Signed)
Reason for Disposition   Depression is main problem or symptom (e.g., feelings of sadness or hopelessness)   Requesting to talk with a counselor (mental health worker, psychiatrist, etc.)    Answer Assessment - Initial Assessment Questions  Pt feels he is becoming little more anxious than he has in the past  Diagnosed with depression years ago   Does not talk to a therapist or take any anti anxiety medications  No feelings of harming himself or others no SI  Sadness and feeling alone sometimes in this country  Originally from [place]  Has a 70 year old daughter here which makes it easier to be away from [place]  Would like to be able to travel to see his family  Awaiting documents per pt  Pt is losing sleep with anxiety  Tries to limit caffeine intake  Has some close friends he could speak with if needed  Off [facility] # pt declined at this time  Would like a talk therapist to discuss stressors/coping mechanisms  Denies excessive use of ETOH  Does not exercise as much as he'd like  Knows he needs lifestyle changes  Nor recent illnesses  No difficulty breathing  No C/O palpitations or CP  No fever  Discussed mindful meditation with patient    Protocols used: Anxiety and Panic Attack-A-OH, Depression-A-OH  Advised per nursing triage protocol.  Verbalized understanding and agreement with instructions and disposition.     Recommended disposition for patient:Disposition: Scheduled Televisit Appointment    If patient referred to UC/ED advised that they may require further follow up and testing after the visit with their primary care office.     Instructed patient to call back for any new, worsening, or worrisome symptoms or concerns any time day or night.

## 2022-01-30 NOTE — Telephone Encounter (Signed)
Regarding: Intense Anxiety  ----- Message from Dorcas Carrow sent at 01/30/2022  3:22 PM EDT -----  Paul Mccarthy [id number], 46 year old, male    Calls today:  Sick    What are the symptoms  Intense Anxiety   How long has patient been sick? N/A  What has pt. tried at home N/A      Person calling on behalf of patient: Patient (self)    CALL BACK NUMBER: [phone]  Best time to call back: Anytime  Cell phone:   Other phone:    Patient's language of care: English    Patient does not need an interpreter.    Patient's PCP: Jonette Pesa, MD    Primary Care Home Site:  [facility]

## 2022-01-31 ENCOUNTER — Ambulatory Visit: Payer: No Typology Code available for payment source | Attending: Family Medicine | Admitting: Family

## 2022-01-31 ENCOUNTER — Telehealth (HOSPITAL_BASED_OUTPATIENT_CLINIC_OR_DEPARTMENT_OTHER): Payer: Self-pay

## 2022-01-31 DIAGNOSIS — F411 Generalized anxiety disorder: Secondary | ICD-10-CM | POA: Diagnosis present

## 2022-01-31 MED ORDER — ESCITALOPRAM OXALATE 5 MG PO TABS
5.0000 mg | ORAL_TABLET | Freq: Every day | ORAL | 0 refills | Status: DC
Start: 2022-01-31 — End: 2022-03-06

## 2022-01-31 NOTE — Progress Notes (Signed)
Paul Mccarthy is a 46 year old male patient of Cecille Amsterdam, MD who is scheduled for a telemedicine visit for anxiety .    SUBJECTIVE:    Reports feeling of anxiety for a long time   Lately getting worse in the last 2-3 months   Originally from [place]    Feels lonely in th [place], thinking about going back   Has a 62 year old daughter here, mother will not move to [place] with child  Finds the anxiety is effecting asleep, wakes up in the middle of the night, takes 2-3 hours to fall back asleep   Taking melatonin, finds this somewhat helpful  Overall has low motivation   Effecting his work   Interested in restarting medications to treat his anxiety   Appetite is okay   Denies self harm, SI,HI   Hx of Depression     Patient Active Problem List:     Anal fissure     Allergic rhinitis due to pollen     Major depression, recurrent (HCC)     Social anxiety disorder     Hyperopia     Right shoulder pain     Tendinopathy of rotator cuff     Scapular dyskinesis     Low testosterone     Suspected COVID-19 virus infection     Rash     Elevated blood pressure reading without diagnosis of hypertension     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 Take with meals., Disp: 60 tablet, Rfl: 0    No current facility-administered medications on file prior to visit.    All medications reviewed with patient.  Review of Patient's Allergies indicates:  No Known Allergies  Social History    Tobacco Use      Smoking status: Never      Smokeless tobacco: Never    Alcohol use: Yes      Alcohol/week: 4.2 standard drinks of alcohol      Types: 5 Standard drinks or equivalent per week      Comment: on weekends    Drug use: No    Medical History reviewed with the patient.  Relevant changes have been made in 'history' section.    Recent laboratories and imaging reviewed prior to visit.    Review of Systems/HPI:  All other systems reviewed  and negative.    OBJECTIVE:    Vital Signs:  Vitals not collected for this telemedicine visit.    Physical Exam:  General: speech clear at appropriate rate, speaking in full sentences  Psychiatric:  Mood and behavior normal     ASSESSMENT AND PLAN:  Additional plans reviewed with patient and listed below under patient instructions and provided in AVS.    1. 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   Pt aware of risk for SI and worsening depression  ED precautions reviewed with pt in detail   Referral for psych placed        - escitalopram (LEXAPRO) 5 MG tablet; Take 1 tablet by mouth in the morning. For 7 days, then increase to 2 tabs by mouth daily.  Dispense:  60 tablet; Refill: 0  - RFL TO ADULT OUTPT PSYCH (NEW) [facility]        Reasons to call or return to clinic were discussed.    I explained the diagnosis and treatment plan, and the patient/parent/guardian expressed understanding of the content. We discussed all medicines prescribed and the importance of medication adherence. The patient/parent/guardian expressed understanding and no barriers to adherence were identified.  Possible side effects of the prescribed medication(s) were explained.  I attempted to answer all questions regarding the diagnosis and the proposed treatment.    We discussed the patient's current medications including proper use and potential side effects. The patient expressed understanding and no barriers to adherence were identified.     1. The patient indicates understanding of these issues and agrees with the plan. Brief care plan is updated and reviewed with the patient.   2. The patient is given an After Visit Summary sheet that lists all medications with directions, allergies, orders placed during this encounter,  and follow-up instructions.   3. I reviewed the patient's medical information and medical history   4. I have reviewed the pertinent medical, family, and social history sections including the medications and allergies.    Gwenith Spitz, NP

## 2022-01-31 NOTE — Telephone Encounter (Addendum)
Lvm for patient to call back to schedule an app f/u.      ----- Message from Gwenith Spitz, NP sent at 01/31/2022  3:19 PM EDT -----  Regarding: Please schedule follow up with PCP team    PCFrontDeskPoolList : [place]: EC Support Staff:[id number]    F/U PCP:  Visit Type (in office/tele): tele, Reason for Visit:anxiety follow up, Time Frame:  2 weeks    ##If PCP follow up selected and no appointment available in specified time frame, please book with another onsite provider or send message to PCP for guidance.    Thank you,  Gwenith Spitz, NP                Paul Mccarthy Paul Mccarthy, 01/31/2022

## 2022-03-06 ENCOUNTER — Encounter (HOSPITAL_BASED_OUTPATIENT_CLINIC_OR_DEPARTMENT_OTHER): Payer: Self-pay | Admitting: Family Medicine

## 2022-03-06 ENCOUNTER — Ambulatory Visit: Payer: No Typology Code available for payment source | Attending: Family Medicine | Admitting: Family Medicine

## 2022-03-06 ENCOUNTER — Other Ambulatory Visit: Payer: Self-pay

## 2022-03-06 ENCOUNTER — Other Ambulatory Visit (HOSPITAL_BASED_OUTPATIENT_CLINIC_OR_DEPARTMENT_OTHER): Payer: No Typology Code available for payment source | Admitting: Dermatology

## 2022-03-06 VITALS — BP 123/76 | HR 63 | Temp 97.0°F | Resp 16 | Ht 77.0 in | Wt 236.0 lb

## 2022-03-06 DIAGNOSIS — Z1212 Encounter for screening for malignant neoplasm of rectum: Secondary | ICD-10-CM | POA: Insufficient documentation

## 2022-03-06 DIAGNOSIS — L99 Other disorders of skin and subcutaneous tissue in diseases classified elsewhere: Secondary | ICD-10-CM | POA: Diagnosis present

## 2022-03-06 DIAGNOSIS — Z23 Encounter for immunization: Secondary | ICD-10-CM | POA: Diagnosis present

## 2022-03-06 DIAGNOSIS — R21 Rash and other nonspecific skin eruption: Secondary | ICD-10-CM | POA: Insufficient documentation

## 2022-03-06 DIAGNOSIS — Z1211 Encounter for screening for malignant neoplasm of colon: Secondary | ICD-10-CM | POA: Insufficient documentation

## 2022-03-06 DIAGNOSIS — E854 Organ-limited amyloidosis: Secondary | ICD-10-CM | POA: Insufficient documentation

## 2022-03-06 NOTE — e-consult (Addendum)
Thank you for referring your patient to me with use of the teledermatology system. I am basing my recommendations upon the information provided by the referring physician, including images, and upon review of the problem list, medications, and allergies as documented in the electronic medical record. I have not had the benefit of personally interviewing or physically examining the patient.        Consultation request:  46 year old male  presents for 2 to three mo ago started w/ a rash. It started on the inner left thigh. But then spread to involve:   Both thighs, epseically inner thighs. But extending down with two patches below the knees, b/l.   Also behind the knees. Both armpit regions. And the neck/upper chest area. And the upper eyelids. And behind the ears.   All is itchy, scratches it.   Wonrdered about food, cut out pork, didn't help. Staying away from creatine, whey, for the gym. Stopped for > 2 weeks, but no difference. Uses pre work out w/ argenine. And has caffeine.  Stopped that for a week but that did not help either.   No creams. Has tried  benadyrl, helps take the itch away a little bit.   No h/o ezema. +Seasonal allergies.   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 He applied the recommended cream but did not seem to help. But  that rash went away on its own after sommonths. He is not sure if current rashes are related.   Seasonal allergies.   No fevers.          Image interpretation:  Photos from 03/06/2022 reviewed:  - red patches, some scaly, on the face, ears, neck, axilla, lower legs.   - In photo "3" there may be an irregular brown papule or patch.      Assessment:  My differential includes contact dermatitis vs a hypersensitivity reaction such as to a medication vs SDRIFE vs an id reaction vs systemic contact dermatitis vs CTCL. I recommend follow-up in person in dermatology clinic.     There may be a pigmented lesion in photo 3, though it is  very out of focus and because of this I cannot make a diagnosis. I cannot rule out an atypical or malignant pigmented lesion without an in-person exam with dermoscopy and consideration of biopsies. We can examine to see if there is a concerning pigmented lesion here at his upcoming appointment.      Recommendations:  - Would ask about any new medications.   - For his body, please start trial of triamcinolone 0.1% ointment BID, 2 weeks on, 2 weeks off. Maximum use of two weeks per month. Please counsel on steroid side effects including skin atrophy, fragility, telangiectasias, striae, pigmentary changes. Do not use on face, axillae, or groin. Please counsel the patient that topical steroids are contraindicated in pregnancy and breastfeeding if the patient is of child-bearing potential.   - For the face, please start trial of hydrocortisone 2.5% ointment BID, 2 weeks on, 2 weeks off. Maximum use of two weeks per month. Please counsel on steroid side effects including skin atrophy, fragility, telangiectasias, striae, pigmentary changes. Please counsel the patient that topical steroids are contraindicated in pregnancy and breastfeeding if patient is of child-bearing potential.   - There is an association with topical steroid use around the eyes and the development of glaucoma. I would not start hydrocortisone to the face if he has a history  of glaucoma. Would also caution to stop hydrocortisone if he develops eye pain, pressure, or redness.   - I will arrange a f/u with [place] in 4-6 weeks.         Time spent on this e-Consultation :  >68min     Please let me know whether I can be of further assistance and please let me know how the patient responds to your management.  Sincerely,    Maudry Mayhew, MD  [facility]

## 2022-03-06 NOTE — Progress Notes (Signed)
SUBJECTIVE:  46 year old male  presents for 2 to three mo ago started w/ a rash. It started on the inner left thigh. But then spread to involve:  Both thighs, epseically inner thighs. But extending down with two patches below the knees, b/l.  Also behind the knees. Both armpit regions. And the neck/upper chest area. And the upper eyelids. And behind the ears.   All is itchy, scratches it.  Wonrdered about food, cut out pork, didn't help. Staying away from creatine, whey, for the gym. Stopped for > 2 weeks, but no difference. Uses pre work out w/ argenine. And has caffeine.  Stopped that for a week but that did not help either.   No creams. Has tried  benadyrl, helps take the itch away a little bit.   No h/o ezema. +Seasonal allergies.   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 He applied the recommended cream but did not seem to help. But  that rash went away on its own after sommonths. He is not sure if current rashes are related.   Seasonal allergies.   No fevers.     OBJECTIVE:  general: alert, well appearing, no distress   BP 123/76 (Site: LA, Position: Sitting, Cuff Size: Lrg)   Pulse 63   Temp 97 F (36.1 C) (Temporal)   Resp 16   Ht 6\' 5"  (1.956 m)   Wt 107 kg (236 lb)   SpO2 99%   BMI 27.99 kg/m      Most Recent BP Reading(s)  03/06/22 : 123/76  05/02/21 : 110/71  03/29/21 : 116/67      Neck: Supple, no mass, no lad   Cvs: Rrr, no murmur   Skin: erythematous plaques that are generally confluent b/l axialle, b/l medial thighs, neck/ upper chest region. Upper eylids involved as well, and behind ears, with some fissuring. Some of these areas appear to be peeling an/or scaly.  No lad.   46 year old 50 seen today for the following issues:    (R21) Rash  (primary encounter diagnosis)  Comment: per above. Not clear what this is. I am hesitatn to trial anyting as it may interfere if a bx is neded. We will  Plan: REFERRAL TO E-CONSULTATION  TELEDERMATOLOGY        And cont zyrtec q am and bendryl q night for sx relife for now    (Z12.11,  Z12.12) Encounter for colorectal cancer screening  Comment: agrees to / would like  Plan: STOOL DNA          (Z23) Need for prophylactic vaccination and inoculation against influenza  Comment: agrees to / would like  Plan: IMMUNIZATION ADMIN SINGLE, IIV4 VACC PRESERV         FREE AGE 46 MONTHS AND OLDER, 0.5ML, IM               We discussed the patient's medications. The patient/guardian expressed understanding and no barriers to adherence were identified.    The patient/guardian understands and agrees with the plan.

## 2022-03-06 NOTE — Progress Notes (Signed)
Influenza Vaccine Procedure  March 06, 2022    1. Has the patient received the information for the influenza vaccine? Yes    2. Does the patient have any of the following contraindications?  Allergy to eggs? No  Allergic reaction to previous influenza vaccines? No  Any other problems to previous influenza vaccines? No  Paralyzed by Guillain-Barre syndrome?  No  Current moderate or severe illness? No  Allergy to contact lens solution? No    3. The vaccine has been administered in the usual fashion.     Immunization information reviewed. Current VIS reviewed and given to patient/ guardian. Verbal assent obtained from patient/ guardian.  See immunization/Injection module or chart review for date of publication and additional information. Verbal assent obtained from patient/guardian. Comfort measures for possible side effects reviewed.     03/06/2022  VIS given prior to administration and reviewed with the patient and or legal guardian. Patient understands the disease and the vaccine. See immunization/Injection module or chart review for date of publication and additional information.  Taletha Twiford, RN

## 2022-03-07 ENCOUNTER — Encounter (HOSPITAL_BASED_OUTPATIENT_CLINIC_OR_DEPARTMENT_OTHER): Payer: Self-pay | Admitting: Family Medicine

## 2022-03-07 ENCOUNTER — Other Ambulatory Visit (HOSPITAL_BASED_OUTPATIENT_CLINIC_OR_DEPARTMENT_OTHER): Payer: Self-pay | Admitting: Family Medicine

## 2022-03-07 MED ORDER — TRIAMCINOLONE ACETONIDE 0.1 % EX OINT
TOPICAL_OINTMENT | CUTANEOUS | 1 refills | Status: DC
Start: 2022-03-07 — End: 2022-04-30

## 2022-03-07 MED ORDER — HYDROCORTISONE 2.5 % EX CREA
TOPICAL_CREAM | CUTANEOUS | 2 refills | Status: AC
Start: 2022-03-07 — End: 2022-05-08

## 2022-03-07 MED ORDER — CETIRIZINE HCL 10 MG PO TABS
10.0000 mg | ORAL_TABLET | Freq: Every day | ORAL | 2 refills | Status: DC
Start: 2022-03-07 — End: 2022-04-30

## 2022-03-07 MED ORDER — DIPHENHYDRAMINE HCL 25 MG PO CAPS
ORAL_CAPSULE | ORAL | 1 refills | Status: DC
Start: 2022-03-07 — End: 2022-04-30

## 2022-04-21 ENCOUNTER — Encounter (HOSPITAL_BASED_OUTPATIENT_CLINIC_OR_DEPARTMENT_OTHER): Payer: Self-pay

## 2022-04-30 ENCOUNTER — Ambulatory Visit: Payer: No Typology Code available for payment source | Attending: Dermatology | Admitting: Dermatology

## 2022-04-30 ENCOUNTER — Other Ambulatory Visit: Payer: Self-pay

## 2022-04-30 ENCOUNTER — Encounter (HOSPITAL_BASED_OUTPATIENT_CLINIC_OR_DEPARTMENT_OTHER): Payer: Self-pay | Admitting: Family Medicine

## 2022-04-30 DIAGNOSIS — L219 Seborrheic dermatitis, unspecified: Secondary | ICD-10-CM | POA: Diagnosis not present

## 2022-04-30 DIAGNOSIS — R21 Rash and other nonspecific skin eruption: Secondary | ICD-10-CM

## 2022-04-30 MED ORDER — KETOCONAZOLE 2 % EX CREA
TOPICAL_CREAM | Freq: Every day | CUTANEOUS | 0 refills | Status: AC
Start: 2022-04-30 — End: 2022-05-30

## 2022-04-30 NOTE — Progress Notes (Signed)
[facility]  [address], [place], [place] [address]    Dermatology Clinic Note    CC: Rash  ?  HPI: Paul Mccarthy is a 47 year old male     Rash. Has been coming a going for the past few years. Is using topical steroids and goes away with it. Takes no meds besides SSRIs, though this is after the onset of this rash.   ?  ROS: negative, no other skin complaints.  Feels well, no systemic complaints.   ?  Past Dermatologic hx:  ?  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  Meds:  ?     Medication List            Accurate as of April 30, 2022  2:40 PM. If you have any questions, ask your nurse or doctor.                START taking these medications      ketoconazole 2 % cream  Commonly known as: NIZORAL  Apply topically daily For seborrhea (face, behind ears)  Started by: Kelvin Cellar, MD            CONTINUE taking these medications      cetirizine 10 MG tablet  Commonly known as: ZYRTEC  Take 1 tablet by mouth in the morning.     diphenhydrAMINE 25 MG capsule  Commonly known as: BENADRYL  One or two capsules each night as needed for itching     hydrocortisone 2.5 % cream  Apply twice daily to the rash of the armpits and behind the ears. Use twice daily, 2 weeks on, 2 weeks off, etc.     triamcinolone 0.1 % ointment  Commonly known as: KENALOG  Apply to the rash of the legs and neck/upper chest twice daily. Use twice daily, 2 weeks on, 2 weeks off, etc.               Where to Get Your Medications        These medications were sent to [facility], [place], [place] [address]      Hours: Mon/Tues/wed/Thurs 8:00am-8:00 pm, Fri 10:00am -5:00pm, Sat 10:00am -1:00pm Phone: [phone]   ketoconazole 2 % cream       ?  All:  Review of Patient's Allergies indicates:  No Known Allergies?     SHx:  ?Social History    Tobacco Use      Smoking status: Never      Smokeless tobacco: Never    Alcohol use: Yes      Alcohol/week: 4.2 standard drinks of alcohol      Types: 5 Standard drinks or equivalent per week      Comment: on weekends    Drug use: No       FHx:  Hx  of melanoma - , hx of NMSC -  ?  Physical exam/Assessment/Plan:  Well appearing pt in NAD  Mood and affect are wnl  A skin examination was performed including scalp, head, eyes, ears, nose,  lips,  neck, chest, axillae, abdomen, back, buttocks, bilateral upper extremities, bilateral lower extremities, hands, feet, fingers, toes  Skin type: II    # Rash.   ?- scaly eczematous patches on the trunk and extremities, but mostly on the flexural surfaces of the extremities. Favor CTCL vs contact derm vs hypersensitivity reaction (?SDRIFE, though he is only on an SSRI which he started after the rash appears)  - cont. Triamcinolone 0.1% ointment BID, 2 weeks on, 2 weeks off. Maximum use of two weeks per month. Steroid side effects discussed including skin atrophy, fragility, telangiectasias, striae, pigmentary changes.   - will refer for patch testing.   - Punch biopsy:   PUNCH BIOPSY PROCEDURE NOTE  -informed consent obtained, time out performed  -lesion was anesthetized with Lidocaine 2% w/ epi  -Punch biopsy performed in the following location(s): R thigh  -wound closed and hemostasis achieved using simplex interrupted sutures  -wound dressed using vaseline and a bandaid  -wound care instructions were given  -the specimen will be sent to pathology and the patient will be notified of the results    PATIENT/PROCEDURE VERIFICATION DOCUMENTATION    Correct patient: Yes  Correct procedure: Yes  Correct side, site, mark visible  if applicable: Yes  Correct position: Yes  Special equipment/implant(s) present, if applicable: NA    Time-out completed, documented by provider doing procedure or designated team member:  Kelvin Cellar, MD   [phone]     Suture removal to be scheduled in 14 days.     # seb derm  - subtle red patches on the face and behind ears  - ketoconazole cream BID    RTC: 48mo  ?  ?  Harolyn Rutherford, MD  [facility]         CC: Jonette Pesa, MD  [place] [address]

## 2022-05-01 MED ORDER — DIPHENHYDRAMINE HCL 25 MG PO CAPS
ORAL_CAPSULE | ORAL | 1 refills | Status: AC
Start: 2022-05-01 — End: 2022-07-01

## 2022-05-01 MED ORDER — TRIAMCINOLONE ACETONIDE 0.1 % EX OINT
TOPICAL_OINTMENT | CUTANEOUS | 1 refills | Status: AC
Start: 2022-05-01 — End: 2022-07-01

## 2022-05-01 MED ORDER — CETIRIZINE HCL 10 MG PO TABS
10.00 mg | ORAL_TABLET | Freq: Every day | ORAL | 2 refills | Status: AC
Start: 2022-05-01 — End: 2022-07-02

## 2022-05-01 NOTE — Telephone Encounter (Signed)
PER Patient (self), Paul Mccarthy is a 47 year old male has requested a refill of      -  Cetirizine   - Benadryl   - Triamcinolone oint       Last Office Visit: 03/06/22 with Zigmund Gottron  Last Physical Exam: 09/02/06     There are no preventive care reminders to display for this patient.     Other Med Adult:  Most Recent BP Reading(s)  03/06/22 : 123/76        Cholesterol (mg/dl)   Date Value   09/02/2006 128     LOW DENSITY LIPOPROTEIN DIRECT (mg/dL)   Date Value   03/20/2014 85     HIGH DENSITY LIPOPROTEIN (mg/dl)   Date Value   09/02/2006 33     No results found for: "TG"      THYROID SCREEN TSH REFLEX FT4 (uIU/mL)   Date Value   09/25/2020 2.220         TSH (THYROID STIM HORMONE) (uIU/mL)   Date Value   02/27/2010 1.61       HEMOGLOBIN A1C (%)   Date Value   03/20/2014 5.2       No results found for: "POCA1C"      No results found for: "INR"    SODIUM (mmol/L)   Date Value   09/25/2020 142       POTASSIUM (mmol/L)   Date Value   09/25/2020 4.3           CREATININE (mg/dL)   Date Value   09/25/2020 1.1        Documented patient preferred pharmacies:    [facility], [place]  Phone: [phone] Fax: [phone]

## 2022-05-12 ENCOUNTER — Encounter (HOSPITAL_BASED_OUTPATIENT_CLINIC_OR_DEPARTMENT_OTHER): Payer: Self-pay | Admitting: Family Medicine

## 2022-05-12 DIAGNOSIS — F411 Generalized anxiety disorder: Secondary | ICD-10-CM

## 2022-05-13 NOTE — Telephone Encounter (Signed)
Pended escitalopram 5 mg once daily to provider to review and approve

## 2022-05-14 MED ORDER — ESCITALOPRAM OXALATE 5 MG PO TABS
5.0000 mg | ORAL_TABLET | Freq: Every day | ORAL | 3 refills | Status: DC
Start: 2022-05-14 — End: 2023-08-11

## 2022-05-15 LAB — SURGICAL PATH SPECIMEN DERM CLINIC

## 2022-05-16 ENCOUNTER — Ambulatory Visit: Payer: No Typology Code available for payment source | Attending: Dermatology | Admitting: Registered Nurse

## 2022-05-16 ENCOUNTER — Other Ambulatory Visit: Payer: Self-pay

## 2022-05-16 DIAGNOSIS — Z4802 Encounter for removal of sutures: Secondary | ICD-10-CM | POA: Insufficient documentation

## 2022-05-16 NOTE — Progress Notes (Signed)
Patient presents for suture removal right thigh. The wound is well healed without signs of infection.  The sutures are removed. Return prn.

## 2022-06-04 ENCOUNTER — Telehealth (HOSPITAL_BASED_OUTPATIENT_CLINIC_OR_DEPARTMENT_OTHER): Payer: Self-pay

## 2022-06-04 NOTE — Telephone Encounter (Addendum)
Spoke with patient and relayed Dr. Gwendolyn Fill message below - no questions.  Is available today (06/04/22) after 4pm to receive a call from Dr. Ronny Flurry.        ----- Message from Kelvin Cellar, MD sent at 06/03/2022  5:30 PM EST -----  Can you let the patient know the biopsy results were nonspecific. They could not determine the cause of this rash.     Can you let him know I have tried to reach him by phone without success. Can you ask when is a good time to call him to discuss that plan for management of his rash?    Thanks!    Harolyn Rutherford, MD  [facility]

## 2022-07-09 ENCOUNTER — Other Ambulatory Visit (HOSPITAL_BASED_OUTPATIENT_CLINIC_OR_DEPARTMENT_OTHER): Payer: Self-pay

## 2022-07-09 DIAGNOSIS — Z1211 Encounter for screening for malignant neoplasm of colon: Secondary | ICD-10-CM

## 2022-07-09 NOTE — Telephone Encounter (Signed)
Outreach colorectal cancer scnreening    Pt reached. He said he did the kit and dropped it off at [facility] but it was sent back to him.  I told him before he drops it off to [facility] to make sure he rips off the mailing label and that underneath that will be the returning address to the company.  Pt agreed to do the test again. I ordered a new kit.  Lynann Beaver, [place], 07/09/2022

## 2022-08-20 ENCOUNTER — Encounter (HOSPITAL_BASED_OUTPATIENT_CLINIC_OR_DEPARTMENT_OTHER): Payer: No Typology Code available for payment source | Admitting: Dermatology

## 2023-03-23 ENCOUNTER — Encounter (HOSPITAL_BASED_OUTPATIENT_CLINIC_OR_DEPARTMENT_OTHER): Payer: Self-pay | Admitting: Registered Nurse

## 2023-03-23 ENCOUNTER — Encounter (HOSPITAL_BASED_OUTPATIENT_CLINIC_OR_DEPARTMENT_OTHER): Payer: Self-pay | Admitting: Family Medicine

## 2023-03-26 ENCOUNTER — Encounter (HOSPITAL_BASED_OUTPATIENT_CLINIC_OR_DEPARTMENT_OTHER): Payer: Self-pay | Admitting: Family Medicine

## 2023-03-26 ENCOUNTER — Ambulatory Visit: Payer: No Typology Code available for payment source | Attending: Family Medicine | Admitting: Family Medicine

## 2023-03-26 DIAGNOSIS — E854 Organ-limited amyloidosis: Secondary | ICD-10-CM | POA: Insufficient documentation

## 2023-03-26 DIAGNOSIS — L99 Other disorders of skin and subcutaneous tissue in diseases classified elsewhere: Secondary | ICD-10-CM | POA: Insufficient documentation

## 2023-03-26 DIAGNOSIS — H538 Other visual disturbances: Secondary | ICD-10-CM | POA: Diagnosis present

## 2023-03-26 DIAGNOSIS — M79601 Pain in right arm: Secondary | ICD-10-CM | POA: Insufficient documentation

## 2023-03-26 NOTE — Progress Notes (Signed)
 This patient was identified as meeting criteria for a televisit rather than an in person visit due to public health concerns around COVID-19. A complete assessment and plan is detailed in the note, all of which were conducted remotely using telephone technology.  Patient identity was verbally confirmed by the patient/guardian  at the beginning of the visit  Patient/guardian verbally consented to care by televisit as appropriate.   Patient/guardian was located at their residence during the visit and confirmed that they understood they were encouraged to be in private location due to personal health information being discussed.  Patient/guardian was informed how to access face-to-face care in the event of an emergency.  Provider was located in a [facility] Ambulatory exam room during the visit.  If this is a new patient visit, all available records and medical history were reviewed by the provider.  Visit length was approx 20 minutes and counseling was done on the diagnoses indicated in the visit.    [phone]     S  47 year old male,  Eyes feel tired when watching t.v. no double vision. And needs glasses for reading.  Slow, progressive. Not  a pain, but like a discomfort, no dulbe vision.  No headaches.       Specialty Scheduling Center: [phone]    Rash resolved generally, can flare up a little but not severe.  Maybe related to certain foods?    The biceps pain moved up toward the shoulder but overall is resolving      O:   Alert, pleasant, sounds well on the phone and video      Pertinent results reviewed    47 year old Paul Mccarthy seen today for the following issues:    (H53.8) Blurry vision  (primary encounter diagnosis)  Comment: discussed optinos. Has been seen by [facility] Before.   Plan: REFERRAL TO OPHTHALMOLOGY (INT)        Written information provided and reviewed with patient/guardian.     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     (M79.601) Right arm pain  Comment: self resolving  Plan: discussed would give this a bit more time, then when starting exercise again, start slow, graded excercises.        We discussed the patient?s medications. The patient/guardian expressed understanding and no barriers to adherence were identified.    The patient/guardian understands and agrees with the plan.

## 2023-04-02 ENCOUNTER — Other Ambulatory Visit: Payer: Self-pay

## 2023-04-03 ENCOUNTER — Ambulatory Visit: Payer: No Typology Code available for payment source | Admitting: Optometry

## 2023-04-08 ENCOUNTER — Other Ambulatory Visit: Payer: Self-pay

## 2023-04-16 ENCOUNTER — Encounter (HOSPITAL_BASED_OUTPATIENT_CLINIC_OR_DEPARTMENT_OTHER): Payer: Self-pay | Admitting: Family Medicine

## 2023-04-17 ENCOUNTER — Other Ambulatory Visit (HOSPITAL_BASED_OUTPATIENT_CLINIC_OR_DEPARTMENT_OTHER): Payer: Self-pay | Admitting: Family Medicine

## 2023-04-17 DIAGNOSIS — A630 Anogenital (venereal) warts: Secondary | ICD-10-CM

## 2023-04-17 MED ORDER — IMIQUIMOD 5 % EX CREA
TOPICAL_CREAM | CUTANEOUS | 1 refills | Status: AC
Start: 2023-04-17 — End: 2023-06-16

## 2023-04-21 ENCOUNTER — Other Ambulatory Visit: Payer: Self-pay

## 2023-04-21 ENCOUNTER — Ambulatory Visit: Payer: No Typology Code available for payment source | Attending: Optometry | Admitting: Optometry

## 2023-04-21 DIAGNOSIS — H52202 Unspecified astigmatism, left eye: Secondary | ICD-10-CM | POA: Diagnosis present

## 2023-04-21 DIAGNOSIS — H04123 Dry eye syndrome of bilateral lacrimal glands: Secondary | ICD-10-CM | POA: Insufficient documentation

## 2023-04-21 DIAGNOSIS — H3589 Other specified retinal disorders: Secondary | ICD-10-CM | POA: Diagnosis present

## 2023-04-21 DIAGNOSIS — H524 Presbyopia: Secondary | ICD-10-CM | POA: Diagnosis present

## 2023-04-21 NOTE — Progress Notes (Signed)
Assessment:   Macular mottling OU   - baseline OCT macula and fundus photos taken today   2. Hyperopia with astigmatism OS, Presbyopia  3. Dry Eye Syndrome OU     Plan:   Pt ed on today's findings. Reviewed and dispsened amsler grid, RTC ASAP with changes Reviewed signs and symptoms of RD and to RTC ASAP if any sudden flashes, floaters, curtain/veil over vision or sudden changes in vision occur. Monitor in 3 months with dilation, sooner if changes are noted.  Pt given updated prescription for spectacle lenses to be worn as needed, options for visual correction were discussed. Pt ed on adaptation period of new spectacle lenses. Monitor annually or sooner should problems arise.   Pt educated on findings. Recommend using warm compresses over both the eyes for 5-10 minutes day, instillation of Systane or Refresh artificial tears 3-4 times a day for burning and irritation, lid scrubs to keep eyes clear of irritants and debris. Monitor annually, sooner prn.

## 2023-04-21 NOTE — Patient Instructions (Signed)
 To help read the fine print up close there are many options for visual correction. These option include:    - Switching between a pair or glasses with your distance prescription and a pair of glasses with your near prescription    - Bifocals with the line, which would have the distance prescription on the top of the lens (above the live) and the near or reading prescription below the line    - Progressive lenses, which are a type of bifocal without the line. The power on the progressive starts with distance in the top of the lens making a progressive transition down the lenses increasing the power for reading    Your prescription is written so that you can do any of the above options.     **For glasses you can bring your prescription to the [facility] in [place] (information on the bottom of the prescription)     Otherwise, you can bring the paper to any other optical shop along with your insurance card. Do make sure the optical shop accepts your insurance if you have coverage for glasses.   Types of other optical shops: [employer], Pediatric specialists), [facility], [facility], [facility], some [facility]    **BOLD OPTIONS FOR [place] TOGETHER WITH [employer] INSURANCE **  ________________________________________________________________________    Dry Eye Treatment:    - Warm compresses (warm facecloth or heated eye masks such as Bruder/Tranquileyes/Thera-Pearl) held over the eyes twice a day for 5-10 minutes, this will help to relieve the blockage of the glands and recover the function of the oil glands in the eyelids to produce better tears. Can also gently massage the eyelids as you are doing the warm compresses    - Using artificial tears 3-4 times a day in both eyes to help with burning, itching, and irritation. These can be purchased over the counter in brand names such as: Refresh, Systane, TheraTears, Blink, Soothe. You should stay away from Visine, Roto, Clear  Eyes or any drops that is used to "get the red out," as these will make symptoms worse.     - Lid scrubs with dilute baby shampoo will keep lids clear of any irritants without stinging the eyes while you are washing your lashes     - Making sure to follow the 20/20/20 rule with any prolonged near work (reading, computer, etc). This means every 20 minutes, look 20 feet away, for at least 20 seconds. This will give your eyes a rest and a chance to blink well to rehydrate the front of your eyes.

## 2023-04-21 NOTE — H&P (Signed)
Pt here for CEE. LEE 11/20/2016 with Dr. Chesley Mires     Pt reports blurry vision @ D and N OU with current NVO , pt states after watching/using phones TV notices "pressure" in eyes, not using any eye drops    (-)burning, itching, redness or tearing  (-)flashes, floaters, diplopia, pain or headaches    Family Hx (-)Glaucoma/AMD/Blindness    No hx of eye injuries or surgeries    Ocular Hx    Dry eye syndrome  Presbyopia

## 2023-05-12 ENCOUNTER — Encounter (HOSPITAL_BASED_OUTPATIENT_CLINIC_OR_DEPARTMENT_OTHER): Payer: Self-pay | Admitting: Optometry

## 2023-07-07 ENCOUNTER — Encounter (HOSPITAL_BASED_OUTPATIENT_CLINIC_OR_DEPARTMENT_OTHER): Payer: Self-pay | Admitting: Dermatology

## 2023-07-21 ENCOUNTER — Ambulatory Visit: Payer: No Typology Code available for payment source | Attending: Optometry | Admitting: Optometry

## 2023-07-21 ENCOUNTER — Other Ambulatory Visit: Payer: Self-pay

## 2023-07-21 DIAGNOSIS — H3589 Other specified retinal disorders: Secondary | ICD-10-CM | POA: Insufficient documentation

## 2023-07-21 DIAGNOSIS — H04123 Dry eye syndrome of bilateral lacrimal glands: Secondary | ICD-10-CM | POA: Diagnosis present

## 2023-07-21 NOTE — H&P (Signed)
 Est here for 3 month f/u and dilation. LEE  04/21/2023 with me    Pt reports clear vision @ D and N OU with current NVO , pt states after watching/using phones TV notices "pressure" in eyes after 30 minutes of use, states overall stable to LEE but noticing some improvement with use of AT    Pt has been using Amsler grid prn, no changes at home per pt     (-)burning, itching, redness or tearing  (-)flashes, floaters, diplopia, pain or headaches    Family Hx (-)Glaucoma/AMD/Blindness    No hx of eye injuries or surgeries    Ocular Hx    Macular mottling OU  Hyperopia with astigmatism OS, Presbyopia  Dry Eye Syndrome OU

## 2023-07-21 NOTE — Progress Notes (Signed)
 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 Hyperopia with astigmatism OS, Presbyopia  3. Dry Eye Syndrome OU     Plan:   Pt ed on today's findings. Reviewed and dispsened amsler grid, RTC ASAP with changes Reviewed signs and symptoms of RD and to RTC ASAP if any sudden flashes, floaters, curtain/veil over vision or sudden changes in vision occur. Monitor in 3-4 months with dilation and repeat OCT macula, sooner if changes are noted.  Continue with current spec Rx. Monitor annually or sooner should problems arise.   Pt educated on findings. Recommend continue using warm compresses over both the eyes for 5-10 minutes day, instillation of Systane or Refresh artificial tears 3-4 times a day for burning and irritation, lid scrubs to keep eyes clear of irritants and debris. Monitor annually, sooner prn.

## 2023-08-05 ENCOUNTER — Other Ambulatory Visit (HOSPITAL_BASED_OUTPATIENT_CLINIC_OR_DEPARTMENT_OTHER): Payer: Self-pay | Admitting: Family Medicine

## 2023-08-05 DIAGNOSIS — Z1211 Encounter for screening for malignant neoplasm of colon: Secondary | ICD-10-CM

## 2023-08-11 ENCOUNTER — Encounter (HOSPITAL_BASED_OUTPATIENT_CLINIC_OR_DEPARTMENT_OTHER): Payer: Self-pay | Admitting: Physician Assistant

## 2023-08-11 DIAGNOSIS — F411 Generalized anxiety disorder: Secondary | ICD-10-CM

## 2023-08-11 NOTE — Telephone Encounter (Signed)
 PER Patient (self), Paul Mccarthy is a 48 year old male has requested a refill of Escitalopram  5 mg tablets.    Last Office Visit: 03/26/2023 K. Carle Chars, MD    Other Med Adult:  Most Recent BP Reading(s)  03/06/22 : 123/76        Cholesterol (mg/dl)   Date Value   16/12/9602 128     LOW DENSITY LIPOPROTEIN DIRECT (mg/dL)   Date Value   54/11/8117 85     HIGH DENSITY LIPOPROTEIN (mg/dl)   Date Value   14/78/2956 33     No results found for: "TG"      THYROID  SCREEN TSH REFLEX FT4 (uIU/mL)   Date Value   09/25/2020 2.220         TSH (THYROID  STIM HORMONE) (uIU/mL)   Date Value   02/27/2010 1.61       HEMOGLOBIN A1C (%)   Date Value   03/20/2014 5.2       No results found for: "POCA1C"      No results found for: "INR"    SODIUM (mmol/L)   Date Value   09/25/2020 142       POTASSIUM (mmol/L)   Date Value   09/25/2020 4.3           CREATININE (mg/dL)   Date Value   21/30/8657 1.1       Documented patient preferred pharmacies:    [facility], [place] - [address]  Phone: [phone] Fax: [phone]

## 2023-08-12 ENCOUNTER — Encounter (HOSPITAL_BASED_OUTPATIENT_CLINIC_OR_DEPARTMENT_OTHER): Payer: Self-pay | Admitting: Family Medicine

## 2023-08-12 MED ORDER — ESCITALOPRAM OXALATE 5 MG PO TABS
5.0000 mg | ORAL_TABLET | Freq: Every day | ORAL | 3 refills | Status: AC
Start: 2023-08-12 — End: 2024-08-12

## 2023-08-20 LAB — STOOL DNA: NONINV COLON CA DNA + OCC BLD SCREEN STL -IMP: NEGATIVE

## 2023-08-21 ENCOUNTER — Ambulatory Visit (HOSPITAL_BASED_OUTPATIENT_CLINIC_OR_DEPARTMENT_OTHER): Payer: Self-pay

## 2023-09-01 ENCOUNTER — Ambulatory Visit (HOSPITAL_BASED_OUTPATIENT_CLINIC_OR_DEPARTMENT_OTHER): Payer: Self-pay | Admitting: Dermatology

## 2023-09-02 ENCOUNTER — Telehealth (HOSPITAL_BASED_OUTPATIENT_CLINIC_OR_DEPARTMENT_OTHER): Payer: Self-pay

## 2023-09-02 ENCOUNTER — Encounter (HOSPITAL_BASED_OUTPATIENT_CLINIC_OR_DEPARTMENT_OTHER): Payer: Self-pay

## 2023-09-02 NOTE — Telephone Encounter (Addendum)
 Letter sent certified.

## 2023-09-02 NOTE — Telephone Encounter (Signed)
 Lionell Riddles, MD to [facility] Nurses Pool (Selected Message)        09/01/23 11:02 PM  Sorry cleaning up my inbox. Can you write a CERTIFIED letter stating the following:    Dear Paul Mccarthy,    I have called you several times without success. I would like to follow-up on your rash. In addition, there may be a concerning lesion on the your leg I would like to evaluate as well. Please call our office at [phone], option 3 to speak to me and make an appointment. For your information, I am also leaving [facility] at the end of this month, but I hope to see you again before I leave.    Best,  Dr. Majorie Scrape    Majorie Scrape, MD  [facility]

## 2023-10-04 ENCOUNTER — Encounter (HOSPITAL_BASED_OUTPATIENT_CLINIC_OR_DEPARTMENT_OTHER): Payer: Self-pay | Admitting: Family Medicine

## 2023-10-16 ENCOUNTER — Ambulatory Visit: Attending: Family Medicine | Admitting: Family Medicine

## 2023-10-16 ENCOUNTER — Telehealth (HOSPITAL_BASED_OUTPATIENT_CLINIC_OR_DEPARTMENT_OTHER): Payer: Self-pay | Admitting: Family Medicine

## 2023-10-16 DIAGNOSIS — R7989 Other specified abnormal findings of blood chemistry: Secondary | ICD-10-CM | POA: Insufficient documentation

## 2023-10-16 DIAGNOSIS — R350 Frequency of micturition: Secondary | ICD-10-CM | POA: Diagnosis present

## 2023-10-16 NOTE — Telephone Encounter (Signed)
 [facility] Outreach  Call made to Paul Mccarthy to schedule an appointment for Labs only . No interpreter needed.   Patient (self) answered at [phone] ; appointment booked.  I have completed the following communication and reminder steps: Patient notified by telephone  [name], 10/16/2023    On behalf of PCP: Benedetta Kent, MD

## 2023-10-16 NOTE — Telephone Encounter (Signed)
-----   Message from Vinie Rebel sent at 10/16/2023 11:42 AM EDT -----  To Support staff Pool:    Can you please contact this patient to set them up for a lab appointment for within the next 14 days.  The lab orders are in the system.    The patient DOES need to be fasting. And labs must be drawn BEFORE 10 A.M.    Thank you,  [place].

## 2023-10-16 NOTE — Progress Notes (Signed)
 This patient was identified as meeting criteria for a televisit rather than an in person visit due to public health concerns around COVID-19. A complete assessment and plan is detailed in the note, all of which were conducted remotely using telephone technology.  Patient identity was verbally confirmed by the patient/guardian with 2 identifiers (name, date of birth, and/or address) at the beginning of the visit  Patient/guardian verbally consented to care by televisit as appropriate.   Patient/guardian was located at their residence during the visit and confirmed that they understood they were encouraged to be in private location due to personal health information being discussed.  Patient/guardian was informed how to access face-to-face care in the event of an emergency.  Provider was located in a [facility] Ambulatory exam room during the visit.  If this is a new patient visit, all available records and medical history were reviewed by the provider.  Visit length was approx 20 minutes and counseling was done on the diagnoses indicated in the visit.    [phone]     S  48 year old male,  Low T. 200s. Started replacement. 3 mo now. Takes 'zero 35 or 40 twice per week'. Here locally. But woul dlike to get back plugged in to [place] [facility].   Increased urin frequency, stream is fine. 3 x per night. No blood.   Increaesed water and doing pre-work out.     O:   Alert, pleasant, sounds well on the phone      Pertinent results reviewed      49 year old [place] seen today for the following issues:    (R79.89) Low testosterone   (primary encounter diagnosis)  Comment: hx of low testosterone . Recent hx per above. discussed . We will:  Plan: TESTOSTERONE  TOTAL, REFERRAL TO ENDOCRINOLOGY         (INT), PROSTATIC SPECIFIC ANTIGEN, HEPATIC         FUNCTION PANEL, HEMOGLOBIN A1C, HEMOGLOBIN,         LIPID PANEL, BUN (UREA NITROGEN), CREATININE,         POTASSIUM, THYROID  SCREEN TSH REFLEX FT4            (R35.0) Urinary  frequency  Comment: check  Plan: HEMOGLOBIN A1C        And discussed effects of caffeine in the pre work out       We discussed the patient's medications. The patient/guardian expressed understanding and no barriers to adherence were identified.    The patient/guardian understands and agrees with the plan.

## 2023-10-26 ENCOUNTER — Other Ambulatory Visit: Payer: Self-pay

## 2023-10-26 ENCOUNTER — Ambulatory Visit: Attending: Family Medicine

## 2023-10-26 ENCOUNTER — Ambulatory Visit (HOSPITAL_BASED_OUTPATIENT_CLINIC_OR_DEPARTMENT_OTHER): Payer: Self-pay

## 2023-10-26 DIAGNOSIS — R7989 Other specified abnormal findings of blood chemistry: Secondary | ICD-10-CM | POA: Insufficient documentation

## 2023-10-26 DIAGNOSIS — R350 Frequency of micturition: Secondary | ICD-10-CM | POA: Diagnosis present

## 2023-10-26 DIAGNOSIS — K760 Fatty (change of) liver, not elsewhere classified: Secondary | ICD-10-CM

## 2023-10-26 LAB — HEMOGLOBIN A1C
ESTIMATED AVERAGE GLUCOSE: 108 mg/dL (ref 74–160)
HEMOGLOBIN A1C: 5.4 % (ref 4.0–5.6)

## 2023-10-26 LAB — POTASSIUM: POTASSIUM: 4.4 mmol/L (ref 3.5–5.1)

## 2023-10-26 LAB — BUN (UREA NITROGEN): BUN (UREA NITROGEN): 16 mg/dL (ref 7–18)

## 2023-10-26 LAB — THYROID SCREEN TSH REFLEX FT4: THYROID SCREEN TSH REFLEX FT4: 1.41 u[IU]/mL (ref 0.270–4.200)

## 2023-10-26 LAB — HEMOGLOBIN: HEMOGLOBIN: 17.1 g/dL (ref 13.7–17.5)

## 2023-10-26 LAB — HEPATIC FUNCTION PANEL
ALANINE AMINOTRANSFERASE: 36 U/L (ref 12–45)
ALBUMIN: 4.2 g/dL (ref 3.4–5.2)
ALKALINE PHOSPHATASE: 126 U/L — ABNORMAL HIGH (ref 45–117)
ASPARTATE AMINOTRANSFERASE: 29 U/L (ref 8–34)
BILIRUBIN DIRECT: 0.4 mg/dL — ABNORMAL HIGH (ref 0.0–0.2)
BILIRUBIN TOTAL: 1.2 mg/dL — ABNORMAL HIGH (ref 0.2–1.0)
INDIRECT BILIRUBIN: 0.8 mg/dL (ref 0.2–0.9)
TOTAL PROTEIN: 6.8 g/dL (ref 6.4–8.2)

## 2023-10-26 LAB — LIPID PANEL
Cholesterol: 175 mg/dL (ref 0–239)
HIGH DENSITY LIPOPROTEIN: 26 mg/dL — ABNORMAL LOW (ref 40–60)
LOW DENSITY LIPOPROTEIN DIRECT: 62 mg/dL (ref 0–189)
TRIGLYCERIDES: 364 mg/dL — ABNORMAL HIGH (ref 0–150)

## 2023-10-26 LAB — TESTOSTERONE TOTAL: TESTOSTERONE TOTAL: 783 ng/dL — ABNORMAL HIGH (ref 193–740)

## 2023-10-26 LAB — CREATININE: CREATININE: 1 mg/dL (ref 0.7–1.2)

## 2023-10-26 LAB — PROSTATIC SPECIFIC ANTIGEN: PROSTATIC SPECIFIC ANTIGEN: 0.97 ng/mL (ref 0.000–4.000)

## 2023-10-26 NOTE — Progress Notes (Signed)
 Released open orders.  Blood draw, 3 SST and 2 LAV tube.  Prepared specimens for courier.  Erminio Millin, [place], 10/26/2023

## 2023-10-29 ENCOUNTER — Telehealth (HOSPITAL_BASED_OUTPATIENT_CLINIC_OR_DEPARTMENT_OTHER): Payer: Self-pay

## 2023-10-29 NOTE — Telephone Encounter (Signed)
 Called pt number to reschedule oct 13 appt with ddr. Swami  Booked dec 3 at 1230  Left msg on voicemail with new date and time

## 2023-10-30 NOTE — Telephone Encounter (Signed)
 Took incoming call  Relayed results and plan  Was fasting, but had huge [place] BBQ on Sunday  Reviewed lower fat and cholesterol diet, moderation, increase activity and fluids  This will also help liver  Willing for liver US   Called and set up  Verbalized back and agrees with plan      Discussed testosterone , and he is contact with specialist, they did check hime about 3 weeks ago and thinks it was high then too and they adjusted dose  Will see them every 3 months, could consider calling to let them know the results today and see if further adjustment needed  Verbalized back and agrees with plan      Apt with PCP in 3 months   Verbalized back and agrees with plan

## 2023-10-30 NOTE — Telephone Encounter (Signed)
 Left a message on an identified voice mail   To call clinic for results  Await a call back

## 2023-10-30 NOTE — Telephone Encounter (Signed)
-----   Message from [place]o sent at 10/30/2023  7:30 AM EDT -----  Dear RN,    Covering for Dr. Benedetta.     Please:    1. Create Telephone encounter for this patient.  2. Share with the patient the attached results     Lipids with low HDL, LDL in range, elev triglycerides, borderline high total cholesterol   A1C wnl, Hb wnl, PSA wnl, BUN wnl, Cr wnl, K wnl, TSH wnl,   Testosterone  slightly elev  LFTs with elev liver enzyme (Alk phos, bili)    Plan:  1. Pls encourage low-fat diet, inclusion of healthy fats in diet to raise HDL, please use your judgment on nutrition advice. Please also offer referral to nutritionist to further discuss dietary changes and provide support. Did patient do these labs fasting?     2. Testosterone  slightly elevated - it appears this patient may be receiving T outside of [facility], please encourage him to follow-up with outside provider about this lab, difficult for us  to manage if we are not prescribing the T. He has upcoming appt in Dec with [place] [facility].     3. LFTs with elev liver enzyme; next step US  liver elastography to get a better look at the liver and understand if there is any fibrosis at this current stage; I placed order for the lab please tell him to get the US  done      1. Schedule f/u appointment with me / Dr. Benedetta in 3 months for f/u    2. Type of Outreach: 3 phone calls and if unable to reach leave VM and encourage him to call back.     3. Document the conversation in the Telephone Encounter and close the encounter, no need to send back to me.     Thank you,  Alan Jack, PA-C       ----- Message -----  From: Interface, Lab  Sent: 10/26/2023   1:20 PM EDT  To: Vinie Benedetta, MD

## 2023-11-04 ENCOUNTER — Ambulatory Visit: Admitting: Optometry

## 2023-12-14 ENCOUNTER — Other Ambulatory Visit (HOSPITAL_BASED_OUTPATIENT_CLINIC_OR_DEPARTMENT_OTHER): Payer: Self-pay

## 2023-12-14 DIAGNOSIS — K760 Fatty (change of) liver, not elsewhere classified: Secondary | ICD-10-CM

## 2023-12-17 ENCOUNTER — Ambulatory Visit (HOSPITAL_BASED_OUTPATIENT_CLINIC_OR_DEPARTMENT_OTHER): Admission: RE | Admit: 2023-12-17 | Source: Ambulatory Visit

## 2023-12-17 ENCOUNTER — Ambulatory Visit (HOSPITAL_BASED_OUTPATIENT_CLINIC_OR_DEPARTMENT_OTHER)

## 2023-12-21 ENCOUNTER — Encounter (HOSPITAL_BASED_OUTPATIENT_CLINIC_OR_DEPARTMENT_OTHER): Payer: Self-pay | Admitting: Family Medicine

## 2024-01-11 ENCOUNTER — Ambulatory Visit

## 2024-01-12 ENCOUNTER — Other Ambulatory Visit: Payer: Self-pay

## 2024-01-12 ENCOUNTER — Ambulatory Visit
Admission: RE | Admit: 2024-01-12 | Discharge: 2024-01-12 | Disposition: A | Discharge status: Home / Self Care | Attending: Diagnostic Radiology | Admitting: Diagnostic Radiology

## 2024-01-12 ENCOUNTER — Ambulatory Visit (HOSPITAL_BASED_OUTPATIENT_CLINIC_OR_DEPARTMENT_OTHER)
Admission: RE | Admit: 2024-01-12 | Discharge: 2024-01-12 | Disposition: A | Discharge status: Home / Self Care | Source: Ambulatory Visit

## 2024-01-12 DIAGNOSIS — K760 Fatty (change of) liver, not elsewhere classified: Secondary | ICD-10-CM | POA: Diagnosis present

## 2024-01-12 DIAGNOSIS — K7401 Hepatic fibrosis, early fibrosis: Secondary | ICD-10-CM | POA: Diagnosis present

## 2024-01-12 DIAGNOSIS — R7989 Other specified abnormal findings of blood chemistry: Secondary | ICD-10-CM | POA: Diagnosis present

## 2024-01-13 ENCOUNTER — Ambulatory Visit (HOSPITAL_BASED_OUTPATIENT_CLINIC_OR_DEPARTMENT_OTHER): Payer: Self-pay

## 2024-01-13 DIAGNOSIS — K760 Fatty (change of) liver, not elsewhere classified: Secondary | ICD-10-CM

## 2024-01-18 ENCOUNTER — Ambulatory Visit (HOSPITAL_BASED_OUTPATIENT_CLINIC_OR_DEPARTMENT_OTHER): Payer: Self-pay

## 2024-01-18 DIAGNOSIS — K760 Fatty (change of) liver, not elsewhere classified: Secondary | ICD-10-CM | POA: Insufficient documentation

## 2024-01-27 ENCOUNTER — Ambulatory Visit: Admitting: Optometry

## 2024-02-02 ENCOUNTER — Ambulatory Visit: Attending: Family Medicine | Admitting: Family Medicine

## 2024-02-02 ENCOUNTER — Telehealth (HOSPITAL_BASED_OUTPATIENT_CLINIC_OR_DEPARTMENT_OTHER): Payer: Self-pay | Admitting: Family Medicine

## 2024-02-02 ENCOUNTER — Encounter (HOSPITAL_BASED_OUTPATIENT_CLINIC_OR_DEPARTMENT_OTHER): Payer: Self-pay | Admitting: Family Medicine

## 2024-02-02 ENCOUNTER — Other Ambulatory Visit: Payer: Self-pay

## 2024-02-02 VITALS — BP 126/82 | HR 66 | Temp 98.0°F | Wt 228.0 lb

## 2024-02-02 DIAGNOSIS — R030 Elevated blood-pressure reading, without diagnosis of hypertension: Secondary | ICD-10-CM | POA: Insufficient documentation

## 2024-02-02 DIAGNOSIS — R7989 Other specified abnormal findings of blood chemistry: Secondary | ICD-10-CM | POA: Diagnosis present

## 2024-02-02 DIAGNOSIS — Z0000 Encounter for general adult medical examination without abnormal findings: Secondary | ICD-10-CM | POA: Diagnosis not present

## 2024-02-02 DIAGNOSIS — Z23 Encounter for immunization: Secondary | ICD-10-CM | POA: Insufficient documentation

## 2024-02-02 DIAGNOSIS — F4010 Social phobia, unspecified: Secondary | ICD-10-CM | POA: Diagnosis present

## 2024-02-02 DIAGNOSIS — B079 Viral wart, unspecified: Secondary | ICD-10-CM | POA: Diagnosis present

## 2024-02-02 NOTE — Progress Notes (Signed)
 The patient verbally consented to an audio recording of their visit to assist with the completion of documentation. The patient is aware the recording is not retained after the visit is summarized.  SUBJECTIVE:  48 year old male       History of Present Illness  Paul Mccarthy presents with concerns about testosterone  therapy and its side effects.  He has been on testosterone  therapy since mid-April, approximately six months, and is considering stopping it due to lack of perceived benefits such as improved energy. No significant changes in energy levels have been noted, and he feels the same as before starting the therapy. His testosterone  level was initially around 250, which was considered low, prompting the start of the therapy. He has been tapering the dose   He has noticed an increase in urinary frequency and a decrease in urinary flow. Hair loss has also been observed, which he suspects might be related to the testosterone  therapy.  He underwent an ultrasound a few weeks ago due to concerns about his liver.  He consumes alcohol occasionally, about once every two weeks, sometimes up to 5 drinks. He also mentions a diet high in carbohydrates.  He reports a wart on his scalp that has been itching more recently.  He is currently taking escitalopram .    OBJECTIVE:  general: alert, well appearing, no distress   BP 126/82   Pulse 66   Temp 98 F (36.7 C) (Temporal)   Wt 103.4 kg (228 lb)   SpO2 96%   BMI 27.04 kg/m      Most Recent BP Reading(s)  02/02/24 : 126/82  03/06/22 : 123/76  05/02/21 : 110/71      Most Recent Weight Reading(s)  02/02/24 : 103.4 kg (228 lb)  03/06/22 : 107 kg (236 lb)  05/02/21 : 104.8 kg (231 lb)  09/18/20 : 108 kg (238 lb)  09/05/20 : 107.5 kg (237 lb)    Verucus lesion scalp  Neuro: grossly normal and non-focal  Assessment & Plan  Low testosterone  on exogenous testosterone , self tapering off  - Discuss testosterone  discontinuation and management with endocrinologist on December  3rd.  - Recheck liver function tests in three months to assess impact of discontinuing testosterone .    Abnormal liver function tests likely related to testosterone   Liver ultrasound shows F1, F2 . Liver function worsened since starting testosterone . Alcohol intake moderate. Discussed potential impact of testosterone  on liver.  - Recheck liver function tests in three months after discontinuing testosterone .  - Monitor alcohol intake and consider dietary modifications if liver function does not improve.  Hair loss possibly related to testosterone  therapy  Hair loss possibly related to testosterone  therapy. Minoxidil will likley be preferred due to safety profile.  - will f/u after endoc appt  - Consider minoxidil for hair loss after endocrinology consultation.    Cutaneous wart, treated with cryotherapy  Verbal parq done. Would like cryotherapy  Cryotherapy performed on scalp wart. Informed about post-treatment irritation.  - Apply Vaseline to treated area to manage irritation post-cryotherapy.  Blood pressure monitoring (no hypertension)  Blood pressure well-controlled at 125/80.  - Continue monitoring blood pressure.    General Health Maintenance  Routine wellness visit to review overall health.  - Administer influenza vaccine.  The patient indicates understanding of these issues and agrees with the plan.   I spent a total of >40 minutes on this visit on the date of service (total time includes all activities performed on the date of service)

## 2024-02-02 NOTE — Progress Notes (Signed)
 Influenza and Pneumococcal Vaccine Procedure  February 02, 2024    1. Has the patient received the information for the influenza and pneumococcal vaccine? Yes    2. Does the patient have any of the following contraindications?  Allergy  to eggs? No  Allergic reaction to previous influenza vaccines? No  Any other problems to previous influenza vaccines? No  Paralyzed by Guillain-Barre syndrome?  No  Current moderate or severe illness? No    3. The vaccines have been administered in the usual fashion and the patient/guardian was instructed to wait 20 minutes before leaving the building in the event of an allergic reaction:       Prior to Shingrix administration questions asked:  1-Do you currently have any symptoms of Shingles?no  2- Are you feeling sick today?no  3-Do you have any allergies?no  4-Have you ever fainted or had any reaction to vaccines?no    Immunization information reviewed. Current VIS reviewed and given to patient/ guardian. Verbal assent obtained from patient/ guardian. Comfort measures for possible side effects reviewed.   VIS given prior to administration and reviewed with the patient and or legal guardian. Patient understands the disease and the vaccine. See immunization/Injection module or chart review for date of publication and additional information.

## 2024-02-02 NOTE — Telephone Encounter (Signed)
Central Refill Department to complete a benefit analysis for the Shingrix Vaccine.    The vaccine is covered under the patient's [employer] medical coverage.    Please choose Private

## 2024-02-03 ENCOUNTER — Other Ambulatory Visit (HOSPITAL_BASED_OUTPATIENT_CLINIC_OR_DEPARTMENT_OTHER): Payer: Self-pay | Admitting: Family Medicine

## 2024-02-03 DIAGNOSIS — K760 Fatty (change of) liver, not elsewhere classified: Secondary | ICD-10-CM

## 2024-03-02 ENCOUNTER — Other Ambulatory Visit: Payer: Self-pay

## 2024-03-02 ENCOUNTER — Ambulatory Visit

## 2024-03-02 VITALS — BP 114/53 | HR 73 | Temp 97.1°F | Wt 224.0 lb

## 2024-03-02 DIAGNOSIS — F553 Abuse of steroids or hormones: Secondary | ICD-10-CM | POA: Diagnosis not present

## 2024-03-02 DIAGNOSIS — E230 Hypopituitarism: Secondary | ICD-10-CM | POA: Diagnosis not present

## 2024-03-02 NOTE — Progress Notes (Signed)
 ENDOCRINOLOGY NOTE (SMS/CMS)    Patient Name: Paul Mccarthy  Date of Birth: 1975-12-15  FMW:[id number]    Reason for visit : 48 year old low testosterone    Referring provider: Benedetta Kent, MD      HPI: Paul Mccarthy is a 48 year old male, presenting to the Endocrinology clinic for above reason.     Hypogonadism:   Diagnosed: 2019  Hx of using testosterone  for workouts for about 2 months in 2016 followed by which he had symptoms.   MRI pituitary was normal in 2019.   He was on Clomid  from 2019 to 2021 when the medication was discontinued. Labs were normal at that time so no medications were prescribed at the time.       He reports that he was feeling lack of energy and not well for some years. He decided to use TRT from an outside facility in April 2025. He noticed no significant improvement. He did this until about 2 weeks ago. He noticed hair loss but since he noticed no improvement energy. He also noticed breast tenderness.     He was doing 0.35 ml of testosterone  cypionate injections 2 times a week. [facility]   He does not know the concentration of the prescription. His last dose was 2 weeks ago.     Energy: drop in levels now.   Sex drive: low.   Ability to get/maintain an erection: not for the last 2 weeks.   Wakes up with erections (times/week): denies.   Weight: Most Recent Weight Reading(s)  03/02/24 : 101.6 kg (224 lb)  02/02/24 : 103.4 kg (228 lb)  03/06/22 : 107 kg (236 lb)  05/02/21 : 104.8 kg (231 lb)  09/18/20 : 108 kg (238 lb)    Focus: normal.   Mood: low  Change in frequency of shaving: denies.   Gynecomastia: yes  Galactorrhea: denies.   Osteopenia/osteoporosis: denies.   Hx of fracture: denies.     Hx of anabolic steroids/testosterone  booster use: see above.   Hx of opiod or glucocorticoid use: denies.     Treatment(s) tried: see above.     Fertility: denies interest at this time.     Family hx of hypogonadism or anosmia: denies.   Timeline of secondary sexual  characteristics: normal.   Hx of head trauma: none.  Hx of testicular trauma/infection: none.     Occupation: holiday representative.     Cardiac Hx: denies.     Hx of OSA: denies.      Hx of clots: denies.     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 He had surgery in [place].     Reviewed past medical history, past surgical history, social and family history. Chart updated as indicated.     escitalopram  (LEXAPRO ) 5 MG tablet, Take 1 tablet by mouth daily, Disp: 90 tablet, Rfl: 3    No current facility-administered medications on file prior to visit.      Review of Patient's Allergies indicates:  No Known Allergies    Review of systems:   10-point review of systems performed and pertinent positives as listed in HPI.     Physical exam:   Vitals:   There were no vitals taken for this visit.   General: alert, in no acute distress  Head: atraumatic  Ears: normal externally  Nose: normal externally  Eyes: normal conjunctiva, normal EOM  Neck: normal on inspection, no neck vein distension, no lumps, masses.   Chest: normal  chest expansion, normal effort.   Neuro: alert and oriented X3, no tremor  Psych: normal mood and affect.     Lab review:   Component      Latest Ref Rng 07/23/2017  8:34 AM 10/29/2017  8:44 AM 04/05/2018  8:15 AM 10/08/2018  9:02 AM   SODIUM      136 - 145 mmol/L  139      POTASSIUM      3.5 - 5.1 mmol/L  4.2      CHLORIDE      98 - 107 mmol/L  107      CARBON DIOXIDE      21 - 32 mmol/L  27      ANION GAP      10 - 22 mmol/L  5      CALCIUM      8.5 - 10.1 mg/dl  8.6      Glucose Random      74 - 160 mg/dL  864      BUN (UREA  NITROGEN)      7 - 18 mg/dL  16      TOTAL PROTEIN      6.4 - 8.2 g/dL  7.0      ALBUMIN      3.4 - 5.2 g/dL  3.8      BILIRUBIN TOTAL      0.2 - 1.0 mg/dL  1.1 (H)      ALKALINE PHOSPHATASE      45 - 117 U/L  129 (H)      ASPARTATE AMINOTRANSFERASE      8 - 34 U/L  28      CREATININE      0.7 - 1.2 mg/dL  1.1      ESTIMATED GLOMERULAR FILT RATE      >60 ML/MIN  > 60      ALANINE  AMINOTRANSFERASE      12 - 45 U/L  47 (H)      BILIRUBIN DIRECT      0.0 - 0.2 mg/dL       INDIRECT BILIRUBIN      0.2 - 0.9 mg/dL       PTH INTACT CALCIUM      8.5 - 10.1 mg/dL       PTH INTACT      15 - 65 pg/mL       HEMOGLOBIN A1C      4.0 - 5.6 %       ESTIMATED AVERAGE GLUCOSE      74 - 160 mg/dL       TESTOSTERONE  TOTAL      193 - 740 ng/dL 830 (L)  790 (L)  474  728    TESTOSTERONE  FREE      6.8 - 21.5 pg/mL 3.3 ! (E)      FOLLICLE STIMULATING HORMONE      0.7 - 10.8 mIU/mL 2.8       LUTEINIZING HORMONE (LH)      1.2 - 10.6 mIU/mL 4.3  3.9  4.4  7.9    PROLACTIN      2.5 - 17.4 ng/mL  6.1      THYROID  SCREEN TSH REFLEX FT4      0.270 - 4.200 uIU/mL  1.230      SEX HORMONE BINDING GLOBULIN      16.5 - 55.9 nmol/L  22.0  23.1     PROSTATIC SPECIFIC ANTIGEN      0.000 - 4.000 ng/mL  0.804    HEMOGLOBIN      13.7 - 17.5 g/dL         Component      Latest Ref Rng 06/21/2019  8:55 AM 09/30/2019  9:37 AM 09/25/2020  2:51 PM 10/26/2023  8:07 AM   SODIUM      136 - 145 mmol/L   142     POTASSIUM      3.5 - 5.1 mmol/L   4.3     CHLORIDE      98 - 107 mmol/L   105     CARBON DIOXIDE      21 - 32 mmol/L   25     ANION GAP      10 - 22 mmol/L   11     CALCIUM      8.5 - 10.1 mg/dl   9.7     Glucose Random      74 - 160 mg/dL   90     BUN (UREA  NITROGEN)      7 - 18 mg/dL   21 (H)     TOTAL PROTEIN      6.4 - 8.2 g/dL   7.1  6.8    ALBUMIN      3.4 - 5.2 g/dL   4.6  4.2    BILIRUBIN TOTAL      0.2 - 1.0 mg/dL   1.1 (H)  1.2 (H)    ALKALINE PHOSPHATASE      45 - 117 U/L   133 (H)  126 (H)    ASPARTATE AMINOTRANSFERASE      8 - 34 U/L   26  29    CREATININE      0.7 - 1.2 mg/dL   1.1     ESTIMATED GLOMERULAR FILT RATE      >60 ML/MIN   > 60     ALANINE AMINOTRANSFERASE      12 - 45 U/L   29  36    BILIRUBIN DIRECT      0.0 - 0.2 mg/dL    0.4 (H)    INDIRECT BILIRUBIN      0.2 - 0.9 mg/dL    0.8    PTH INTACT CALCIUM      8.5 - 10.1 mg/dL   9.7     PTH INTACT      15 - 65 pg/mL   33     HEMOGLOBIN A1C      4.0 - 5.6 %    5.4     ESTIMATED AVERAGE GLUCOSE      74 - 160 mg/dL    891    TESTOSTERONE  TOTAL      193 - 740 ng/dL 401  645   216 (H)    TESTOSTERONE  FREE      6.8 - 21.5 pg/mL       FOLLICLE STIMULATING HORMONE      0.7 - 10.8 mIU/mL       LUTEINIZING HORMONE (LH)      1.2 - 10.6 mIU/mL       PROLACTIN      2.5 - 17.4 ng/mL       THYROID  SCREEN TSH REFLEX FT4      0.270 - 4.200 uIU/mL   2.220  1.410    SEX HORMONE BINDING GLOBULIN      16.5 - 55.9 nmol/L       PROSTATIC SPECIFIC ANTIGEN      0.000 - 4.000 ng/mL  0.970    HEMOGLOBIN      13.7 - 17.5 g/dL    82.8         Assessment and plan:     Hx of testosterone  use.   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   Reviewed hx as detailed in HPI. 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 Levels following self discontinuation were normal so he did not take the Clomid  again. He then restarted testosterone  injections through an outside clinic in 06/2023 due to symptoms of low energy and libido. He was taking injections (unknown dose) twice a week His last dose was about 2 weeks ago and he stopped due to experiencing side effects/lack of clinical benefit. He would like to go back to Clomid  if that is an option. Of note, MRI pituitary was unremarkable in 2019.     We will obtain labs for AM total testosterone  4 weeks after his last injection, between 8-10 am. If levels are low, and patterns remain consistent with central hypogonadism, we can offer Clomid  vs gel formulation of testosterone . He is agreeable to this recommendation. He has no interest in fertility.    The desirable effects of testosterone  administration include the development or maintenance of secondary sexual characteristics and increases in libido, muscle strength, fat-free mass, and bone density. Undesirable effects related directly to testosterone  include acne, prostate disorders (such as  benign prostatic hyperplasia [BPH] symptoms), sleep apnea, and erythrocytosis.  Testosterone  has also been postulated to improve mood and cognition, but these effects have not been demonstrated convincingly. Contraindications to testosterone  replacement include prostate cancer, breast cancer, severe lower urinary tract symptoms, erythrocytosis (HCT>50), severe untreated OSA and uncontrolled heart failure. Potential adverse effects include prostate cancer, VTE, BPH, erythrocytosis, and potentially increased CV risk. Testosterone  therapy may suppress spermatogenesis and impact fertility. Other treatments may be more appropriate for hypogonadal men looking to preserve fertility.      Monitoring treatment goals and side effects is recommended every 3-6 months. Labs in 2 weeks.     Follow up: 4-5 months.   Orders Placed This Encounter      Testosterone  Total      CBC with Platelet      Comprehensive Metabolic Panel      Prostatic Specific Antigen      Sex Hormone Binding Globulin      Luteinizing Hormone (LH)      Follicle Stimulating Hormone      Estradiol      Prolactin      Follow-up Information    None         Patient instructions: There are no Patient Instructions on file for this visit.    I spent a total of 46 minutes on this visit on the date of service (total time includes all activities performed on the date of service)     Everado Pillsbury, MD  Endocrinology, Diabetes and Metabolism  [facility]  Pager [phone]

## 2024-03-16 ENCOUNTER — Encounter (HOSPITAL_BASED_OUTPATIENT_CLINIC_OR_DEPARTMENT_OTHER): Payer: Self-pay

## 2024-03-16 ENCOUNTER — Ambulatory Visit: Admission: RE | Admit: 2024-03-16 | Discharge: 2024-03-16 | Attending: Family Medicine | Admitting: Family Medicine

## 2024-03-16 ENCOUNTER — Ambulatory Visit (HOSPITAL_BASED_OUTPATIENT_CLINIC_OR_DEPARTMENT_OTHER): Payer: Self-pay

## 2024-03-16 ENCOUNTER — Other Ambulatory Visit: Payer: Self-pay

## 2024-03-16 DIAGNOSIS — F553 Abuse of steroids or hormones: Secondary | ICD-10-CM | POA: Insufficient documentation

## 2024-03-16 DIAGNOSIS — E230 Hypopituitarism: Secondary | ICD-10-CM | POA: Diagnosis present

## 2024-03-16 DIAGNOSIS — K760 Fatty (change of) liver, not elsewhere classified: Secondary | ICD-10-CM | POA: Diagnosis present

## 2024-03-16 LAB — CBC WITH PLATELET
ABSOLUTE NRBC COUNT: 0 TH/uL (ref 0.0–0.0)
HEMATOCRIT: 45.3 % (ref 40.1–51.0)
HEMOGLOBIN: 15.2 g/dL (ref 13.7–17.5)
MEAN CORP HGB CONC: 33.6 g/dL (ref 31.0–37.0)
MEAN CORPUSCULAR HGB: 29.3 pg (ref 26.0–34.0)
MEAN CORPUSCULAR VOL: 87.3 fl (ref 80.0–100.0)
MEAN PLATELET VOLUME: 10.9 fL (ref 8.7–12.5)
NRBC %: 0 % (ref 0.0–0.0)
PLATELET COUNT: 182 TH/uL (ref 150–400)
RBC DISTRIBUTION WIDTH STD DEV: 44.4 fL (ref 35.1–46.3)
RED BLOOD CELL COUNT: 5.19 M/uL (ref 4.60–6.10)
WHITE BLOOD CELL COUNT: 7.3 TH/uL (ref 4.0–11.0)

## 2024-03-16 LAB — HEPATIC FUNCTION PANEL
ALANINE AMINOTRANSFERASE: 35 U/L (ref 12–45)
ALBUMIN: 4.2 g/dL (ref 3.4–5.2)
ALKALINE PHOSPHATASE: 96 U/L (ref 45–117)
ASPARTATE AMINOTRANSFERASE: 33 U/L (ref 8–34)
BILIRUBIN DIRECT: 0.3 mg/dL — ABNORMAL HIGH (ref 0.0–0.2)
BILIRUBIN TOTAL: 1.3 mg/dL — ABNORMAL HIGH (ref 0.2–1.0)
INDIRECT BILIRUBIN: 1 mg/dL — ABNORMAL HIGH (ref 0.2–0.9)
TOTAL PROTEIN: 7.1 g/dL (ref 6.4–8.2)

## 2024-03-16 LAB — COMPREHENSIVE METABOLIC PANEL
ALANINE AMINOTRANSFERASE: 35 U/L (ref 12–45)
ALBUMIN: 4.3 g/dL (ref 3.4–5.2)
ALKALINE PHOSPHATASE: 96 U/L (ref 45–117)
ANION GAP: 9 mmol/L — ABNORMAL LOW (ref 10–22)
ASPARTATE AMINOTRANSFERASE: 32 U/L (ref 8–34)
BILIRUBIN TOTAL: 1.3 mg/dL — ABNORMAL HIGH (ref 0.2–1.0)
BUN (UREA NITROGEN): 19 mg/dL — ABNORMAL HIGH (ref 7–18)
CALCIUM: 9.2 mg/dL (ref 8.5–10.5)
CARBON DIOXIDE: 27 mmol/L (ref 21–32)
CHLORIDE: 104 mmol/L (ref 98–107)
CREATININE: 1 mg/dL (ref 0.7–1.2)
ESTIMATED GLOMERULAR FILT RATE: >60 mL/min (ref >60)
Glucose Random: 107 mg/dL (ref 74–160)
POTASSIUM: 4.2 mmol/L (ref 3.5–5.1)
SODIUM: 140 mmol/L (ref 136–145)
TOTAL PROTEIN: 7.1 g/dL (ref 6.4–8.2)

## 2024-03-16 LAB — SEX HORMONE BINDING GLOBULIN: SEX HORMONE BINDING GLOBULIN: 20.8 nmol/L (ref 19.3–76.4)

## 2024-03-16 LAB — PROSTATIC SPECIFIC ANTIGEN: PROSTATIC SPECIFIC ANTIGEN: 1.18 ng/mL (ref 0.000–4.000)

## 2024-03-16 LAB — FOLLICLE STIMULATING HORMONE: FOLLICLE STIMULATING HORMONE: 4.5 m[IU]/mL (ref 1.5–12.4)

## 2024-03-16 LAB — PROLACTIN: PROLACTIN: 8.6 ng/mL (ref 4.0–15.2)

## 2024-03-16 LAB — TESTOSTERONE TOTAL: TESTOSTERONE TOTAL: 364 ng/dL (ref 193–740)

## 2024-03-16 LAB — ESTRADIOL: ESTRADIOL: 29.7 pg/mL (ref 11.3–43.2)

## 2024-03-16 LAB — LUTEINIZING HORMONE (LH): LUTEINIZING HORMONE (LH): 8.6 m[IU]/mL (ref 1.7–8.6)

## 2024-03-16 NOTE — Telephone Encounter (Addendum)
 Spoke with patient. Results and plan of care discussed as noted below. Patient confirmed understanding and had no further questions.    ----- Message from Janya Swami sent at 03/16/2024 11:40 AM EST -----  Dear RN, please inform patient that his most recent testosterone  level and other labs were completely normal. Based on this information, no supplements are needed for testosterone .   Dr. Salvatore  ----- Message -----  From: Interface, Lab  Sent: 03/16/2024  10:37 AM EST  To: Janya Swami, MD

## 2024-03-16 NOTE — Progress Notes (Signed)
 Dear RN, please inform patient that his most recent testosterone  level and other labs were completely normal. Based on this information, no supplements are needed for testosterone .   Dr. Giannis Corpuz

## 2024-05-03 ENCOUNTER — Telehealth (HOSPITAL_BASED_OUTPATIENT_CLINIC_OR_DEPARTMENT_OTHER): Payer: Self-pay

## 2024-05-03 NOTE — Telephone Encounter (Signed)
 Called pt to reschedule appt. No answer lvm with new date and time.

## 2024-07-20 ENCOUNTER — Ambulatory Visit

## 2024-09-28 ENCOUNTER — Ambulatory Visit
